# Patient Record
Sex: Male | Born: 1935 | Race: White | Hispanic: No | Marital: Single | Smoking: Never smoker
Health system: Southern US, Community
[De-identification: ages and names within clinical notes are randomized; demographics above are authoritative.]

## PROBLEM LIST (undated history)

## (undated) DIAGNOSIS — E785 Hyperlipidemia, unspecified: Secondary | ICD-10-CM

## (undated) DIAGNOSIS — F0390 Unspecified dementia without behavioral disturbance: Secondary | ICD-10-CM

## (undated) DIAGNOSIS — G309 Alzheimer's disease, unspecified: Secondary | ICD-10-CM

## (undated) DIAGNOSIS — G459 Transient cerebral ischemic attack, unspecified: Secondary | ICD-10-CM

## (undated) DIAGNOSIS — I10 Essential (primary) hypertension: Secondary | ICD-10-CM

## (undated) DIAGNOSIS — F0280 Dementia in other diseases classified elsewhere without behavioral disturbance: Secondary | ICD-10-CM

## (undated) HISTORY — PX: OTHER SURGICAL HISTORY: SHX169

---

## 2009-01-04 ENCOUNTER — Ambulatory Visit: Payer: Self-pay | Admitting: Gastroenterology

## 2009-01-07 ENCOUNTER — Ambulatory Visit: Payer: Self-pay | Admitting: Unknown Physician Specialty

## 2011-11-28 ENCOUNTER — Inpatient Hospital Stay: Payer: Self-pay | Admitting: Student

## 2013-12-13 ENCOUNTER — Inpatient Hospital Stay: Payer: Self-pay | Admitting: Internal Medicine

## 2013-12-13 LAB — DRUG SCREEN, URINE
Amphetamines, Ur Screen: NEGATIVE (ref ?–1000)
Barbiturates, Ur Screen: NEGATIVE (ref ?–200)
Benzodiazepine, Ur Scrn: NEGATIVE (ref ?–200)
Cannabinoid 50 Ng, Ur ~~LOC~~: NEGATIVE (ref ?–50)
Cocaine Metabolite,Ur ~~LOC~~: NEGATIVE (ref ?–300)
Phencyclidine (PCP) Ur S: NEGATIVE (ref ?–25)
Tricyclic, Ur Screen: NEGATIVE (ref ?–1000)

## 2013-12-13 LAB — COMPREHENSIVE METABOLIC PANEL
Anion Gap: 4 — ABNORMAL LOW (ref 7–16)
BUN: 31 mg/dL — ABNORMAL HIGH (ref 7–18)
Bilirubin,Total: 1.5 mg/dL — ABNORMAL HIGH (ref 0.2–1.0)
Chloride: 107 mmol/L (ref 98–107)
Creatinine: 0.54 mg/dL — ABNORMAL LOW (ref 0.60–1.30)
EGFR (Non-African Amer.): >60
Osmolality: 281 (ref 275–301)
Potassium: 3.9 mmol/L (ref 3.5–5.1)
SGOT(AST): 42 U/L — ABNORMAL HIGH (ref 15–37)
SGPT (ALT): 28 U/L (ref 12–78)
Sodium: 136 mmol/L (ref 136–145)
Total Protein: 7.9 g/dL (ref 6.4–8.2)

## 2013-12-13 LAB — URINALYSIS, COMPLETE
Blood: NEGATIVE
Leukocyte Esterase: NEGATIVE
Nitrite: NEGATIVE
Specific Gravity: 1.022 (ref 1.003–1.030)
WBC UR: <1 /HPF (ref 0–5)

## 2013-12-13 LAB — CK TOTAL AND CKMB (NOT AT ARMC): CK, Total: 200 U/L (ref 35–232)

## 2013-12-13 LAB — CBC
HCT: 41.4 % (ref 40.0–52.0)
MCH: 31.3 pg (ref 26.0–34.0)
RBC: 4.52 10*6/uL (ref 4.40–5.90)
WBC: 10.1 10*3/uL (ref 3.8–10.6)

## 2013-12-13 LAB — TROPONIN I: Troponin-I: 0.17 ng/mL — ABNORMAL HIGH

## 2013-12-14 LAB — CK TOTAL AND CKMB (NOT AT ARMC)
CK, Total: 148 U/L (ref 35–232)
CK, Total: 138 U/L (ref 35–232)
CK-MB: 3.4 ng/mL (ref 0.5–3.6)
CK-MB: 3 ng/mL (ref 0.5–3.6)

## 2013-12-14 LAB — COMPREHENSIVE METABOLIC PANEL WITH GFR
Albumin: 2.9 g/dL — ABNORMAL LOW
Alkaline Phosphatase: 38 U/L — ABNORMAL LOW
Anion Gap: 5 — ABNORMAL LOW
BUN: 25 mg/dL — ABNORMAL HIGH
Bilirubin,Total: 0.4 mg/dL
Calcium, Total: 8.5 mg/dL
Chloride: 109 mmol/L — ABNORMAL HIGH
Co2: 28 mmol/L
Creatinine: 0.95 mg/dL
EGFR (African American): >60
EGFR (Non-African Amer.): >60
Glucose: 133 mg/dL — ABNORMAL HIGH
Osmolality: 289
Potassium: 3.6 mmol/L
SGOT(AST): 24 U/L
SGPT (ALT): 21 U/L
Sodium: 142 mmol/L
Total Protein: 5.7 g/dL — ABNORMAL LOW

## 2013-12-14 LAB — LIPID PANEL
Cholesterol: 142 mg/dL
HDL Cholesterol: 60 mg/dL
Ldl Cholesterol, Calc: 71 mg/dL
Triglycerides: 54 mg/dL
VLDL Cholesterol, Calc: 11 mg/dL

## 2013-12-14 LAB — TROPONIN I
Troponin-I: 0.2 ng/mL — ABNORMAL HIGH
Troponin-I: 0.2 ng/mL — ABNORMAL HIGH

## 2013-12-14 LAB — SEDIMENTATION RATE: Erythrocyte Sed Rate: 6 mm/h

## 2013-12-15 LAB — COMPREHENSIVE METABOLIC PANEL
Albumin: 2.8 g/dL — ABNORMAL LOW (ref 3.4–5.0)
Alkaline Phosphatase: 41 U/L — ABNORMAL LOW
Anion Gap: 2 — ABNORMAL LOW (ref 7–16)
BUN: 23 mg/dL — ABNORMAL HIGH (ref 7–18)
Bilirubin,Total: 0.4 mg/dL (ref 0.2–1.0)
Calcium, Total: 8.5 mg/dL (ref 8.5–10.1)
Co2: 28 mmol/L (ref 21–32)
EGFR (Non-African Amer.): >60
Glucose: 100 mg/dL — ABNORMAL HIGH (ref 65–99)
Osmolality: 281 (ref 275–301)
Potassium: 4 mmol/L (ref 3.5–5.1)
SGOT(AST): 22 U/L (ref 15–37)
SGPT (ALT): 19 U/L (ref 12–78)

## 2013-12-15 LAB — CBC WITH DIFFERENTIAL/PLATELET
Basophil %: 0.9 %
Eosinophil #: 0.1 10*3/uL (ref 0.0–0.7)
Eosinophil %: 2 %
HCT: 36.5 % — ABNORMAL LOW (ref 40.0–52.0)
Lymphocyte #: 1.9 10*3/uL (ref 1.0–3.6)
MCH: 31.3 pg (ref 26.0–34.0)
MCHC: 34.1 g/dL (ref 32.0–36.0)
MCV: 92 fL (ref 80–100)
Monocyte #: 0.6 x10 3/mm (ref 0.2–1.0)
Monocyte %: 7.3 %
Neutrophil #: 4.8 10*3/uL (ref 1.4–6.5)
Neutrophil %: 64.3 %
RBC: 3.98 10*6/uL — ABNORMAL LOW (ref 4.40–5.90)
RDW: 13.4 % (ref 11.5–14.5)

## 2013-12-18 ENCOUNTER — Ambulatory Visit: Payer: Self-pay | Admitting: Neurology

## 2013-12-18 LAB — PSA: PSA: 1.1 ng/mL (ref 0.0–4.0)

## 2013-12-19 LAB — CBC WITH DIFFERENTIAL/PLATELET
Basophil %: 0.8 %
Eosinophil #: 0.2 10*3/uL (ref 0.0–0.7)
HCT: 38.7 % — ABNORMAL LOW (ref 40.0–52.0)
HGB: 13.1 g/dL (ref 13.0–18.0)
Lymphocyte #: 2 10*3/uL (ref 1.0–3.6)
Lymphocyte %: 23.6 %
MCH: 30.5 pg (ref 26.0–34.0)
Monocyte #: 0.8 x10 3/mm (ref 0.2–1.0)
Monocyte %: 8.7 %
Neutrophil #: 5.6 10*3/uL (ref 1.4–6.5)
Neutrophil %: 64.6 %
Platelet: 203 10*3/uL (ref 150–440)
RBC: 4.31 10*6/uL — ABNORMAL LOW (ref 4.40–5.90)
RDW: 13.3 % (ref 11.5–14.5)
WBC: 8.6 10*3/uL (ref 3.8–10.6)

## 2013-12-19 LAB — BASIC METABOLIC PANEL
Anion Gap: 5 — ABNORMAL LOW (ref 7–16)
BUN: 19 mg/dL — ABNORMAL HIGH (ref 7–18)
Calcium, Total: 9 mg/dL (ref 8.5–10.1)
Chloride: 105 mmol/L (ref 98–107)
Co2: 26 mmol/L (ref 21–32)
Creatinine: 0.87 mg/dL (ref 0.60–1.30)
EGFR (African American): >60
Glucose: 87 mg/dL (ref 65–99)
Osmolality: 274 (ref 275–301)
Sodium: 136 mmol/L (ref 136–145)

## 2013-12-21 LAB — BASIC METABOLIC PANEL
Anion Gap: 4 — ABNORMAL LOW (ref 7–16)
BUN: 22 mg/dL — ABNORMAL HIGH (ref 7–18)
Calcium, Total: 9 mg/dL (ref 8.5–10.1)
Chloride: 105 mmol/L (ref 98–107)
Co2: 29 mmol/L (ref 21–32)
Creatinine: 1.05 mg/dL (ref 0.60–1.30)
EGFR (African American): >60
EGFR (Non-African Amer.): >60
Glucose: 89 mg/dL (ref 65–99)
Osmolality: 278 (ref 275–301)
Potassium: 3.8 mmol/L (ref 3.5–5.1)
Sodium: 138 mmol/L (ref 136–145)

## 2013-12-21 LAB — CBC WITH DIFFERENTIAL/PLATELET
Basophil #: 0.1 10*3/uL (ref 0.0–0.1)
Basophil %: 0.9 %
Eosinophil #: 0.2 10*3/uL (ref 0.0–0.7)
Eosinophil %: 2.3 %
HCT: 38.2 % — ABNORMAL LOW (ref 40.0–52.0)
HGB: 12.9 g/dL — ABNORMAL LOW (ref 13.0–18.0)
Lymphocyte #: 1.6 10*3/uL (ref 1.0–3.6)
Lymphocyte %: 22 %
MCH: 30.4 pg (ref 26.0–34.0)
MCHC: 33.6 g/dL (ref 32.0–36.0)
MCV: 91 fL (ref 80–100)
Monocyte #: 0.7 10*3/uL (ref 0.2–1.0)
Monocyte %: 10 %
Neutrophil #: 4.8 10*3/uL (ref 1.4–6.5)
Neutrophil %: 64.8 %
Platelet: 213 10*3/uL (ref 150–440)
RBC: 4.22 10*6/uL — ABNORMAL LOW (ref 4.40–5.90)
RDW: 13.3 % (ref 11.5–14.5)
WBC: 7.4 10*3/uL (ref 3.8–10.6)

## 2015-04-12 NOTE — H&P (Signed)
PATIENT NAME:  Corey Knight, Corey Knight MR#:  [id number] DATE OF BIRTH:  [birth year]  DATE OF ADMISSION:  12/13/2013  PRIMARY CARE PHYSICIAN: Was Kem Kays.   CHIEF COMPLAINT: Confusion, difficulty with ambulation and hallucinations.   HISTORY OF PRESENT ILLNESS: This is a 80 year old male with a history of hypertension (not taking any medications for several months), hyperlipidemia, BPH who presents with above complaints. As per family who is at the bedside, over the last day, the patient has been having visual hallucinations, been very confused. He is leaning to the left side. He fell twice today. He has gait instability and trouble walking. This is very unusual for the patient. Apparently, the patient had visual hallucinations today. He is paranoid. He thinks that people are living in the woods. His blood pressure here was 217/105. He received labetalol and aspirin, and hospitalist was consulted for admission.   REVIEW OF SYSTEMS:  CONSTITUTIONAL: The patient denies any fever, fatigue, weakness.  EYES: No blurred or double vision.  ENT: No ear pain, hearing loss, seasonal allergies. Positive snoring. No postnasal drip or sinus pain.  RESPIRATORY: No cough, wheezing, hemoptysis or COPD.  CARDIOVASCULAR: No chest pain, orthopnea, palpitations, syncope, edema. Positive falls but no syncope. No loss of consciousness. No dyspnea on exertion.  GASTROINTESTINAL: No nausea, vomiting, diarrhea, abdominal pain, melena or ulcers.  GENITOURINARY: No dysuria or hematuria.  ENDOCRINE: No polyuria or polydipsia.  HEMATOLOGIC AND LYMPHATIC: Positive easy bruising. No bleeding or swollen glands.  SKIN: No rash or lesions.  MUSCULOSKELETAL: No cramps or gout.  NEUROLOGIC: No history of CVA, TIA or seizures.  PSYCHIATRIC: No history of anxiety or depression.   PAST MEDICAL HISTORY:  1. Hypertension.  2. Hyperlipidemia.  3. BPH.    MEDICATIONS: The patient is not taking any medications at this time. He is  supposed to be on hypertensive medications but is not taking any.   SOCIAL HISTORY: No tobacco, alcohol or drug use. He lives alone.   FAMILY HISTORY: Positive for Alzheimer's dementia and CVA.    SURGICAL HISTORY: None.   PHYSICAL EXAMINATION:  VITAL SIGNS: Temperature 98.3, pulse 63, respirations 18. Initial blood pressure was 217/105; now is 164/86. He is 98% on room air.  GENERAL: The patient is alert, oriented to name and place, not time.  HEENT: Head is atraumatic. Pupils are round and reactive. Sclerae anicteric. Mucous membranes are moist. Oropharynx is clear.  NECK: Supple without JVD, carotid bruit or enlarged thyroid.  CARDIOVASCULAR: Regular rate and rhythm. No murmurs, rubs or gallops. PMI is not displaced.  LUNGS: Clear to auscultation without crackles, rales, rhonchi or wheezing. Normal percussion. No egophony.  ABDOMEN: Bowel sounds are present. Nontender, nondistended. No hepatosplenomegaly.  NEUROLOGIC: Cranial nerves II through XII are intact. Musculoskeletal strength is 4 out of 5 in the left upper extremity, 5 out of 5 bilateral lower extremities. He has a slight right pronator drift. Babinski sign is downgoing. All other cranial nerves are intact.  EXTREMITIES: No clubbing, cyanosis some mild edema.   BACK: No CVA or vertebral tenderness.   LABORATORIES: White blood cells 10, hemoglobin 14, hematocrit 42, platelets of 172. Sodium 136, potassium 3.9, chloride 107, bicarb 25, BUN 31, creatinine 0.54, glucose is 144. Bilirubin 1.5, alk phos 64, ALT 20, AST 42, total protein 8, albumin is 3.7. CK is 200, CPK-MB 3.5, troponin 0.17. Urinalysis shows negative LCE and nitrites. Urine drug screen is negative. Chest x-ray shows no acute cardiopulmonary disease. CT of the head shows no acute intracranial  hemorrhage or CVA. EKG: Sinus rhythm with first-degree AV block.   ASSESSMENT AND PLAN: A 80 year old male who presents with malignant hypertension and encephalopathy, who has  encephalopathy since today with difficulty in ambulation and leaning to the left side.  1. Probable cerebrovascular accident as per the patient's symptoms or related to encephalopathy secondary to hypertension. The patient will be admitted to telemetry. I have ordered an MRI, carotid Doppler and echocardiogram and have started the patient on aspirin and statin medication. Will check fasting lipids. Ordered neurology checks q.4 hours. Further recommendations after the workup has been performed.  2. Malignant hypertension: The patient has a history of hypertension. He is not on any blood pressure medications right now. At this point will allow permissive hypertension as his symptoms seem to be consistent with a cerebrovascular accident. If the MRI is negative, then we will need to better control his blood pressure with goal systolic being less than 882. I will write for p.r.n. hydralazine order.  3. Encephalopathy secondary to hypertension or cerebrovascular accident: Will continue neurology checks and monitor.  4. Benign prostatic hypertrophy: It does not seem that the patient is taking any medications for this. I actually do not know what his medications are at this time. Will monitor for signs of urinary retention.  5. Edema of LE: dopplers to evaluate for DVT  The patient is FULL CODE status.    TIME SPENT: Approximately 50 minutes.   ____________________________ Donell Beers. Benjie Karvonen, MD spm:gb D: 12/13/2013 23:36:02 ET T: 12/14/2013 00:28:44 ET JOB#: [id number]  cc: Ryelle Ruvalcaba P. Benjie Karvonen, MD, <Dictator> Lorenza Evangelist, MD Donell Beers Kerim Statzer MD ELECTRONICALLY SIGNED 12/14/2013 2:13

## 2015-04-12 NOTE — H&P (Signed)
PATIENT NAME:  Beatris SiHALL, Aikeem D MR#:  [id number] DATE OF BIRTH:  [birth year]  DATE OF ADMISSION:  12/13/2013  ADDENDUM   The patient is also noted to have a troponin of 0.17. He has had chronically elevated troponins in the past of 0.12. He denies any chest pain but will follow. The patient is already on an aspirin, and echo has already been ordered.   ____________________________ Janyth ContesSital P. Juliene PinaMody, MD spm:gb D: 12/14/2013 00:58:15 ET T: 12/14/2013 02:37:56 ET JOB#: [id number]  cc: Maresha Anastos P. Juliene PinaMody, MD, <Dictator> Leniyah Martell P Kina Shiffman MD ELECTRONICALLY SIGNED 12/15/2013 0:02

## 2015-04-13 NOTE — Consult Note (Signed)
PATIENT NAME:  Corey Knight, Corey Knight MR#:  [id number] DATE OF BIRTH:  1935/05/01  DATE OF CONSULTATION:  12/19/2013  REFERRING PHYSICIAN:   CONSULTING PHYSICIAN:  Nakeisha Greenhouse S. Garnetta BuddyFaheem, MD  REASON FOR CONSULTATION: Confusion, difficulty with ambulation, hallucinations.   HISTORY OF PRESENT ILLNESS: The patient is a 80 year old white male with history of hypertension, hyperlipidemia, and BPH who was admitted due to having visual hallucinations and has been confused. He was noted to be leaning to the left side and he fell twice. Has been having gait instability and trouble walking. This is very unusual for the patient and his hallucinations have been worsening. He said that people are living in the woods. His blood pressure was elevated and he was given labetalol and aspirin and I was consulted for admission.   During my interview, the patient was noted to be lying in the bed and he appeared somewhat confused and his sister was present in the room. He reported that he was about to be discharged yesterday, but then they held his discharge. His sister reported that the patient continues to have visual hallucinations especially when he will wake up from sleep. He will think that something is falling from the window or from the curtains. However, he will quickly come back to his baseline. The patient reported that he wants to go back to his home. He was unable to tell me where he is. He told me that [place] but reported that his sister lives in [place]. He stated that he does not have any suicidal or homicidal ideations or plans. He denied having any depression, mood or anxiety symptoms.  Reported that the medications are usually the last resort for him.   PAST PSYCHIATRIC HISTORY: The patient has been diagnosed with Alzheimer dementia and currently started on Aricept by his primary physician. He also has been diagnosed with CVA.   CURRENT MEDICATIONS: The patient is supposed to be  on antihypertensive medication, but he has been noncompliant as an outpatient.   PAST SURGICAL HISTORY: None.   PAST MEDICAL HISTORY: Hypertension, hyperlipidemia, and BPH.  VITAL SIGNS: Temperature 98, pulse 60, respirations 18, blood pressure 153/78.  LABORATORY DATA: Glucose 87, BUN 19, creatinine 0.87, sodium 136, potassium 3.7, chloride 105, bicarbonate 26. Magnesium 1.7. Protein 5.7, AST 22, ALT 19. CK 138, CK-MB was 3. Troponin 0.2. TSH 1.58. UDS was negative. WBC 8.6, RBC 4.31, hemoglobin 13.1, hematocrit 38.7, platelet count 203, RDW 13.3.   MENTAL STATUS EXAMINATION: The patient is a moderately built male who was lying in the bed. He maintained fair eye contact. His speech was low in tone and volume. His mood was anxious. Affect was congruent. His remote memory is intact. He was unable to tell me which recent holiday passed and his sister has to give him a clue. He was also unable to tell me how many quarters are there in 2 dollars and 75 cents. His speech appears to be fluent. He cannot tell me the name of the hospital and was unable to tell me the name of the city he lives in. He appears somewhat apprehensive during the interview. He demonstrated poor insight and judgment.   DIAGNOSTIC IMPRESSION: AXIS I: Dementia, Alzheimer type, without behavior problems.  AXIS II: None.  AXIS III: Please review the medical history.   TREATMENT PLAN:  1.  I have reviewed the patient's records as well as evaluations done by the hospitalist and the neurologist. The patient is currently on  Aricept 5 mg p.o. daily, which was started today.  2.  I will add Namenda 5 mg p.o. daily to help with his memory issues. The patient does not to appear to be having significant hallucinations at this time.  3.  Social workers are helping with the disposition of this patient at this time.   Thank you for allowing me to participate in the care of this patient.  ____________________________ Ardeen Fillers. Garnetta Buddy,  MD usf:sb Knight: 12/19/2013 15:44:31 ET T: 12/19/2013 16:47:23 ET JOB#: [id number]  cc: Ardeen Fillers. Garnetta Buddy, MD, <Dictator> Rhunette Croft MD ELECTRONICALLY SIGNED 12/23/2013 18:32

## 2015-04-13 NOTE — Consult Note (Signed)
PATIENT NAME:  Corey Knight, Corey Knight MR#:  [id number] DATE OF BIRTH:  May 08, 1935  DATE OF CONSULTATION:  12/18/2013  CONSULTING PHYSICIAN:  Pauletta BrownsYuriy Kylen Schliep, MD  REASON FOR CONSULTATION: Altered mental status.   HISTORY OF PRESENT ILLNESS: This is a pleasant 80 year old male with past medical history of hypertension, not on any medications, hyperlipidemia, BPH, presented with confusion and difficulty with ambulation as well as hallucination. The patient was noted by family members to be leaning to the right. The patient apparently had paranoid delusions and hallucinations. There was concern for stroke. The patient was worked up for a CVA. MRI of the brain did not show any acute intracranial pathology.   REVIEW OF SYSTEMS:   CONSTITUTIONAL: No fever. No fatigue. No weakness.  EYES: No blurred vision, double vision.  ENT: No ear pain. No hearing loss.  RESPIRATORY: No cough. No wheezing.  CARDIOVASCULAR: No chest pain. No orthopnea,  GENITOURINARY: No nausea. No vomiting. No diarrhea.  GENITOURINARY: No dysuria. No hematuria. HEMATOLOGIC: No bleeding. No swollen glands.  NEUROLOGICAL: No history of seizures from a neurological standpoint.   SOCIAL HISTORY: No tobacco, alcohol or drug use.   PAST MEDICAL HISTORY: Hypertension, hyperlipidemia and BPH, not on any medications.   LABORATORY DATA: Includes a white blood cell count of 7.5, hemoglobin 12.5, hematocrit 36.5. BUN 23, creatinine 0.96, sodium 139, potassium 4.0. LFTs appear to be within normal range.   PHYSICAL EXAMINATION:   VITAL SIGNS: Include a temperature of 98.6, pulse 80, respirations 19, blood pressure 128/76, pulse oximetry 98%.  NEUROLOGICAL: The patient could not tell me the name of the hospital, could not tell me the exact date or the holiday which passed. His remote memory is intact. The patient was able to tell me how many quarters were in a $1.50, was able to count the days of the week backwards. Speech appears to be fluent.  Cranial nerve examination: Pupils 4 mm to 2 mm, reactive bilaterally. Facial sensation intact. Facial motor intact. Tongue is midline. Uvula elevates symmetrically. Shoulder shrug is intact. Motor strength examination: 5 out of 5 bilateral upper and lower extremities. Sensation intact to light touch and temperature. Coordination: Finger-to-nose intact. Gait not assessed. Reflexes 1+ symmetrical throughout.   IMPRESSION: This is a 80 year old male admitted with encephalopathy, questionably leaning to right, suspicious for a stroke. Work-up with MRI did not show any acute intracranial abnormalities. The patient's mental status slowly improved. Throughout it waxes and wanes. It was thought that this morning he appeared to be more confused, but when I saw him around 11:00 a.m., the patient could tell me the hospital, had difficulty with date and time. Calculation was intact and he could provide his full address. On review of the chart, no reasons for this encephalopathy could be found. From a neurological standpoint, I would not do any further testing at this point. I think there is a possibility of dehydration, possibly delirium, which is improving slowly and waxes and wanes throughout the day. I would not do any further testing.   Thank you. It was a pleasure seeing this patient.   ____________________________ Pauletta BrownsYuriy Kariel Skillman, MD yz:jcm Knight: 12/18/2013 12:08:44 ET T: 12/18/2013 13:33:54 ET JOB#: [id number]  cc: Pauletta BrownsYuriy Avie Checo, MD, <Dictator> Pauletta BrownsYURIY Lizzete Gough MD ELECTRONICALLY SIGNED 01/08/2014 17:15

## 2015-04-13 NOTE — Discharge Summary (Signed)
PATIENT NAME:  SLAYTER, MOORHOUSE MR#:  [id number] DATE OF BIRTH:  25-Nov-1935  DATE OF ADMISSION:  12/13/2013 DATE OF DISCHARGE:  12/22/2013  TYPE OF DISCHARGE: The patient is transferred to skilled nursing facility.   REASON FOR ADMISSION: Altered mental status.   HISTORY OF PRESENT ILLNESS: The patient is a 80 year old male with a history of benign hypertension, hyperlipidemia, medical noncompliance and BPH, who presented to the Emergency Room with progressive mental status changes associated with visual hallucinations. He was also "leaning to the left side." He had fallen twice. In the Emergency Room, the patient had accelerated hypertension and was admitted for further evaluation.   PAST MEDICAL HISTORY:  1.  Benign hypertension.  2.  Hyperlipidemia.  3.  Benign prostatic hypertrophy.   MEDICATIONS ON ADMISSION: None.   ALLERGIES: No known drug allergies.   SOCIAL HISTORY, FAMILY HISTORY AND REVIEW OF SYSTEMS: As per admission note.   PHYSICAL EXAM:  GENERAL: The patient was in no acute distress.  VITAL SIGNS: Remarkable for a blood initial blood pressure of 217/105, heart rate 63, respiratory rate of 18. He was afebrile.  HEENT: Exam was unremarkable.  NECK: Supple without JVD.  LUNGS: Clear.  CARDIAC: Regular rate and rhythm. Normal S1, S2.  ABDOMEN: Soft and nontender.  EXTREMITIES: Without edema.  NEUROLOGIC: Grossly nonfocal other than slight right pronator drift.   HOSPITAL COURSE: The patient was admitted with altered mental status with accelerated hypertension. His blood pressure regimen was optimized and his blood pressure improved. Initial head CT was unremarkable. MRI of the brain showed no acute intracranial abnormality. Small vessel chronic disease was noted. Carotid Dopplers and echocardiogram were nondiagnostic. He was seen by neurology, who felt that the patient had a hypertensive encephalopathy. He continued to have some confusion. He was seen by psychiatry, who  diagnosed the patient with Alzheimer's dementia. He was started on Aricept and Namenda. His symptoms improved slightly. The family was uneasy with the patient going home. A social work consult was obtained and a bed was found for placement. The patient is now transferred to a skilled nursing facility for further care and rehabilitation.   DISCHARGE DIAGNOSES:  1.  Progressive Alzheimer dementia.  2.  Accelerated hypertension.  3.  Hypertensive crisis with encephalopathy.  4.  Hyperlipidemia.  5.  BPH.   DISCHARGE MEDICATIONS:  1.  Aspirin 81 mg p.o. daily.  2.  Zocor 10 mg p.o. at bedtime.  3.  Lopressor 12.5 mg p.o. b.i.d.  4.  Benicar 40 mg p.o. daily.  5.  Norvasc 5 mg p.o. daily.  6.  Hydralazine 25 mg p.o. b.i.d.  7.  Aricept 5 mg p.o. at bedtime.  8.  Namenda 5 mg p.o. at bedtime.   FOLLOW-UP PLANS AND APPOINTMENTS: The patient is a full code. He will be followed by the resident physician at the skilled nursing facility. He is on a 2 gram sodium diet. He will be seen in consultation by physical therapy. Will obtain a CBC and a MET-B in 1 week.  ____________________________ Leonie Douglas. Doy Hutching, MD jds:aw D: 12/22/2013 07:59:15 ET T: 12/22/2013 08:05:55 ET JOB#: [id number]  cc: Leonie Douglas. Doy Hutching, MD, <Dictator> Drayton Tieu Lennice Sites MD ELECTRONICALLY SIGNED 12/22/2013 14:11

## 2015-05-11 ENCOUNTER — Emergency Department: Payer: Commercial Managed Care - HMO

## 2015-05-11 ENCOUNTER — Emergency Department
Admission: EM | Admit: 2015-05-11 | Discharge: 2015-05-11 | Disposition: A | Discharge status: Home / Self Care | Payer: Commercial Managed Care - HMO | Attending: Emergency Medicine | Admitting: Emergency Medicine

## 2015-05-11 ENCOUNTER — Encounter: Payer: Self-pay | Admitting: Emergency Medicine

## 2015-05-11 DIAGNOSIS — I10 Essential (primary) hypertension: Secondary | ICD-10-CM | POA: Insufficient documentation

## 2015-05-11 DIAGNOSIS — N201 Calculus of ureter: Secondary | ICD-10-CM | POA: Diagnosis not present

## 2015-05-11 DIAGNOSIS — R1031 Right lower quadrant pain: Secondary | ICD-10-CM | POA: Diagnosis present

## 2015-05-11 DIAGNOSIS — N1330 Unspecified hydronephrosis: Secondary | ICD-10-CM | POA: Diagnosis not present

## 2015-05-11 HISTORY — DX: Essential (primary) hypertension: I10

## 2015-05-11 HISTORY — DX: Unspecified dementia, unspecified severity, without behavioral disturbance, psychotic disturbance, mood disturbance, and anxiety: F03.90

## 2015-05-11 HISTORY — DX: Hyperlipidemia, unspecified: E78.5

## 2015-05-11 LAB — COMPREHENSIVE METABOLIC PANEL
ALBUMIN: 3.9 g/dL (ref 3.5–5.0)
ALT: 16 U/L — AB (ref 17–63)
AST: 18 U/L (ref 15–41)
Alkaline Phosphatase: 53 U/L (ref 38–126)
Anion gap: 8 (ref 5–15)
BILIRUBIN TOTAL: 0.6 mg/dL (ref 0.3–1.2)
BUN: 36 mg/dL — AB (ref 6–20)
CO2: 27 mmol/L (ref 22–32)
Calcium: 9.5 mg/dL (ref 8.9–10.3)
Chloride: 103 mmol/L (ref 101–111)
Creatinine, Ser: 2.31 mg/dL — ABNORMAL HIGH (ref 0.61–1.24)
GFR, EST AFRICAN AMERICAN: 30 mL/min — AB (ref >60)
GFR, EST NON AFRICAN AMERICAN: 26 mL/min — AB (ref >60)
GLUCOSE: 113 mg/dL — AB (ref 65–99)
Potassium: 4.3 mmol/L (ref 3.5–5.1)
Sodium: 138 mmol/L (ref 135–145)
Total Protein: 7.3 g/dL (ref 6.5–8.1)

## 2015-05-11 LAB — CBC WITH DIFFERENTIAL/PLATELET
Basophils Absolute: 0.1 10*3/uL (ref 0–0.1)
Basophils Relative: 1 %
Eosinophils Absolute: 0 10*3/uL (ref 0–0.7)
Eosinophils Relative: 0 %
HCT: 35.6 % — ABNORMAL LOW (ref 40.0–52.0)
HEMOGLOBIN: 11.8 g/dL — AB (ref 13.0–18.0)
LYMPHS ABS: 1.4 10*3/uL (ref 1.0–3.6)
LYMPHS PCT: 13 %
MCH: 30.7 pg (ref 26.0–34.0)
MCHC: 33.2 g/dL (ref 32.0–36.0)
MCV: 92.4 fL (ref 80.0–100.0)
MONOS PCT: 8 %
Monocytes Absolute: 0.9 10*3/uL (ref 0.2–1.0)
NEUTROS PCT: 78 %
Neutro Abs: 8.5 10*3/uL — ABNORMAL HIGH (ref 1.4–6.5)
Platelets: 217 10*3/uL (ref 150–440)
RBC: 3.85 MIL/uL — ABNORMAL LOW (ref 4.40–5.90)
RDW: 13.1 % (ref 11.5–14.5)
WBC: 10.8 10*3/uL — ABNORMAL HIGH (ref 3.8–10.6)

## 2015-05-11 LAB — URINALYSIS COMPLETE WITH MICROSCOPIC (ARMC ONLY)
Bacteria, UA: NONE SEEN
Bilirubin Urine: NEGATIVE
Glucose, UA: NEGATIVE mg/dL
Hgb urine dipstick: NEGATIVE
KETONES UR: NEGATIVE mg/dL
Leukocytes, UA: NEGATIVE
Nitrite: NEGATIVE
PH: 5 (ref 5.0–8.0)
Protein, ur: 30 mg/dL — AB
Specific Gravity, Urine: 1.02 (ref 1.005–1.030)
Squamous Epithelial / LPF: NONE SEEN

## 2015-05-11 MED ORDER — SODIUM CHLORIDE 0.9 % IV BOLUS (SEPSIS)
1000.0000 mL | Freq: Once | INTRAVENOUS | Status: AC
  Administered 2015-05-11: 1000 mL via INTRAVENOUS

## 2015-05-11 MED ORDER — FENTANYL CITRATE (PF) 100 MCG/2ML IJ SOLN
INTRAMUSCULAR | Status: AC
  Administered 2015-05-11: 25 ug via INTRAVENOUS
  Filled 2015-05-11: qty 2

## 2015-05-11 MED ORDER — ONDANSETRON HCL 4 MG/2ML IJ SOLN
INTRAMUSCULAR | Status: AC
  Administered 2015-05-11: 4 mg via INTRAVENOUS
  Filled 2015-05-11: qty 2

## 2015-05-11 MED ORDER — IOHEXOL 240 MG/ML SOLN: 25.0000 mL | INTRAMUSCULAR | Status: DC

## 2015-05-11 MED ORDER — OXYCODONE HCL 5 MG PO TABS: 5.0000 mg | ORAL_TABLET | Freq: Three times a day (TID) | ORAL | Status: DC | PRN

## 2015-05-11 MED ORDER — FENTANYL CITRATE (PF) 100 MCG/2ML IJ SOLN
25.0000 ug | Freq: Once | INTRAMUSCULAR | Status: AC
  Administered 2015-05-11: 25 ug via INTRAVENOUS

## 2015-05-11 MED ORDER — ONDANSETRON HCL 4 MG/2ML IJ SOLN
4.0000 mg | Freq: Once | INTRAMUSCULAR | Status: AC
  Administered 2015-05-11: 4 mg via INTRAVENOUS

## 2015-05-11 NOTE — ED Notes (Signed)
Patient to ED with c/o LLQ/groin pain for the last 2 days. Patient reports he is unsure if he might have a hernia or be constipated. Further reports no bowel movement in several days and when he did it was hard.

## 2015-05-11 NOTE — ED Provider Notes (Signed)
[facility] Emergency Department Provider Note  ____________________________________________  Time seen: Approximately 1709  I have reviewed the triage vital signs and the nursing notes.   HISTORY  Chief Complaint Abdominal Pain    HPI Corey Knight is a 80 y.o. male who presents to the emergency department for right lower quadrant pain. States the pain started suddenly 2 days ago. He denies history of same.He states his urine stream is slow to start.   Past Medical History  Diagnosis Date  . Hypertension   . Hyperlipemia   . Dementia     There are no active problems to display for this patient.   History reviewed. No pertinent past surgical history.  No current outpatient prescriptions on file.  Allergies Review of patient's allergies indicates no known allergies.  History reviewed. No pertinent family history.  Social History History  Substance Use Topics  . Smoking status: Never Smoker   . Smokeless tobacco: Never Used  . Alcohol Use: No    Review of Systems Constitutional: No fever/chills Eyes: No visual changes. ENT: No sore throat. Cardiovascular: Denies chest pain. Respiratory: Denies shortness of breath. Gastrointestinal: No abdominal pain.  No nausea, no vomiting.  No diarrhea.  No constipation. Genitourinary: Negative for dysuria. Change in urine stream. Musculoskeletal: Denies back pain. Skin: Negative for rash. Neurological: Negative for headaches, focal weakness or numbness.  10-point ROS otherwise negative.  ____________________________________________   PHYSICAL EXAM:  VITAL SIGNS: ED Triage Vitals  Enc Vitals Group     BP 05/11/15 1441 156/83 mmHg     Pulse Rate 05/11/15 1441 64     Resp 05/11/15 1441 20     Temp 05/11/15 1441 97.7 F (36.5 C)     Temp Source 05/11/15 1441 Oral     SpO2 05/11/15 1441 99 %     Weight 05/11/15 1441 165 lb (74.844 kg)     Height 05/11/15 1441  (1.803 m)     Head Cir  --      Peak Flow --      Pain Score 05/11/15 1822 10     Pain Loc --      Pain Edu? --      Excl. in GC? --     Constitutional: Alert and oriented. Well appearing and in no acute distress. Eyes: Conjunctivae are normal. PERRL. EOMI. Head: Atraumatic. Nose: No congestion/rhinnorhea. Mouth/Throat: Mucous membranes are moist.  Oropharynx non-erythematous. Neck: No stridor.   Cardiovascular: Normal rate, regular rhythm. Grossly normal heart sounds.  Good peripheral circulation. Respiratory: Normal respiratory effort.  No retractions. Lungs CTAB. Gastrointestinal: Soft and nontender. No distention. No abdominal bruits. No CVA tenderness. Right lower quadrant tenderness with palpation. Musculoskeletal: No lower extremity tenderness nor edema.  No joint effusions. Neurologic:  Normal speech and language. No gross focal neurologic deficits are appreciated. Speech is normal. No gait instability. Skin:  Skin is warm, dry and intact. No rash noted. Psychiatric: Mood and affect are normal. Speech and behavior are normal.  ____________________________________________   LABS (all labs ordered are listed, but only abnormal results are displayed)  Labs Reviewed  CBC WITH DIFFERENTIAL/PLATELET - Abnormal; Notable for the following:    WBC 10.8 (*)    RBC 3.85 (*)    Hemoglobin 11.8 (*)    HCT 35.6 (*)    Neutro Abs 8.5 (*)    All other components within normal limits  COMPREHENSIVE METABOLIC PANEL - Abnormal; Notable for the following:    Glucose, Bld 113 (*)  BUN 36 (*)    Creatinine, Ser 2.31 (*)    ALT 16 (*)    GFR calc non Af Amer 26 (*)    GFR calc Af Amer 30 (*)    All other components within normal limits  URINALYSIS COMPLETEWITH MICROSCOPIC ([facility])  - Abnormal; Notable for the following:    Color, Urine YELLOW (*)    APPearance CLEAR (*)    Protein, ur 30 (*)    All other components within normal limits    ____________________________________________  EKG   ____________________________________________  RADIOLOGY  Abdominal x-ray concerning for stone.  CT shows 8 mm proximal right ureteral stone with mild hydronephrosis ____________________________________________   PROCEDURES  Procedure(s) performed: None  Critical Care performed: No  ____________________________________________   INITIAL IMPRESSION / ASSESSMENT AND PLAN / ED COURSE  Pertinent labs & imaging results that were available during my care of the patient were reviewed by me and considered in my medical decision making (see chart for details).  initial impression kidney stone.  ----------------------------------------- 7:28 PM on 05/11/2015 -----------------------------------------  Urology paged to discuss further plan. Patient's pain well controlled after IV fluids and pain medication.  ----------------------------------------- 7:41 PM on 05/11/2015 -----------------------------------------  Patient resting comfortably on the stretcher. He denies pain at this time. After speaking with Dr.Dahlstedt, patient will be discharged home and follow up outpatient. Patient and family were given return precautions. They were advised to call first thing Monday morning to schedule an appointment with his urologist or Dr. Apolinar JunesBrandon. He was advised that the pain medication Corey cause some dizziness and was advised to use caution when changing positions or walking.  __________________________________________   FINAL CLINICAL IMPRESSION(S) / ED DIAGNOSES  Final diagnoses:  None    Chinita PesterCari B Farron Watrous, FNP 05/11/15 1944  Myrna Blazeravid Matthew Schaevitz, MD 05/12/15 [phone]

## 2015-05-11 NOTE — ED Notes (Signed)
Pt discharged with family, pt and family verbalize understanding of discharge instructions. E signature pad would not work.

## 2015-05-11 NOTE — ED Notes (Signed)
Pt returned from CT °

## 2015-05-16 ENCOUNTER — Ambulatory Visit
Admission: AD | Admit: 2015-05-16 | Discharge: 2015-05-16 | Disposition: A | Discharge status: Home / Self Care | Payer: Commercial Managed Care - HMO | Source: Ambulatory Visit | Attending: Urology | Admitting: Urology

## 2015-05-16 ENCOUNTER — Other Ambulatory Visit: Payer: Self-pay

## 2015-05-16 ENCOUNTER — Encounter
Admission: AD | Disposition: A | Discharge status: Home / Self Care | Payer: Self-pay | Source: Ambulatory Visit | Attending: Urology

## 2015-05-16 DIAGNOSIS — N132 Hydronephrosis with renal and ureteral calculous obstruction: Secondary | ICD-10-CM | POA: Insufficient documentation

## 2015-05-16 DIAGNOSIS — F0390 Unspecified dementia without behavioral disturbance: Secondary | ICD-10-CM | POA: Diagnosis not present

## 2015-05-16 DIAGNOSIS — Z79899 Other long term (current) drug therapy: Secondary | ICD-10-CM | POA: Insufficient documentation

## 2015-05-16 DIAGNOSIS — I10 Essential (primary) hypertension: Secondary | ICD-10-CM | POA: Insufficient documentation

## 2015-05-16 DIAGNOSIS — Z8673 Personal history of transient ischemic attack (TIA), and cerebral infarction without residual deficits: Secondary | ICD-10-CM | POA: Diagnosis not present

## 2015-05-16 DIAGNOSIS — N201 Calculus of ureter: Secondary | ICD-10-CM

## 2015-05-16 HISTORY — PX: EXTRACORPOREAL SHOCK WAVE LITHOTRIPSY: SHX1557

## 2015-05-16 SURGERY — LITHOTRIPSY, ESWL
Anesthesia: Moderate Sedation | Laterality: Right

## 2015-05-16 MED ORDER — MORPHINE SULFATE 10 MG/ML IJ SOLN
10.0000 mg | Freq: Once | INTRAMUSCULAR | Status: AC
Start: 2015-05-16 — End: 2015-05-16
  Administered 2015-05-16: 10 mg via INTRAMUSCULAR

## 2015-05-16 MED ORDER — NUCYNTA 50 MG PO TABS: 50.0000 mg | ORAL_TABLET | Freq: Four times a day (QID) | ORAL | Status: AC | PRN

## 2015-05-16 MED ORDER — DIPHENHYDRAMINE HCL 25 MG PO CAPS
25.0000 mg | ORAL_CAPSULE | ORAL | Status: AC
  Administered 2015-05-16: 25 mg via ORAL

## 2015-05-16 MED ORDER — LEVOFLOXACIN 500 MG PO TABS
500.0000 mg | ORAL_TABLET | ORAL | Status: AC
  Administered 2015-05-16: 500 mg via ORAL

## 2015-05-16 MED ORDER — PROMETHAZINE HCL 25 MG/ML IJ SOLN
25.0000 mg | Freq: Once | INTRAMUSCULAR | Status: AC
  Administered 2015-05-16: 25 mg via INTRAMUSCULAR

## 2015-05-16 MED ORDER — ONDANSETRON 8 MG PO TBDP: 8.0000 mg | ORAL_TABLET | Freq: Four times a day (QID) | ORAL | Status: AC | PRN

## 2015-05-16 MED ORDER — LEVOFLOXACIN 500 MG PO TABS: 500.0000 mg | ORAL_TABLET | Freq: Every day | ORAL | Status: DC

## 2015-05-16 MED ORDER — DEXTROSE-NACL 5-0.45 % IV SOLN
INTRAVENOUS | Status: DC
  Administered 2015-05-16: 15:00:00 via INTRAVENOUS

## 2015-05-16 MED ORDER — MIDAZOLAM HCL 5 MG/ML IJ SOLN
1.0000 mg | Freq: Once | INTRAMUSCULAR | Status: AC
  Administered 2015-05-16: 1 mg via INTRAMUSCULAR

## 2015-05-16 MED ORDER — TAMSULOSIN HCL 0.4 MG PO CAPS
0.4000 mg | ORAL_CAPSULE | Freq: Every day | ORAL | Status: AC
Start: 2015-05-16 — End: ?

## 2015-05-16 NOTE — OR Nursing (Signed)
Right side bruising from litho.

## 2015-05-16 NOTE — H&P (Signed)
   History reviewed, patient examined, no change in status, stable for surgery.  

## 2015-05-16 NOTE — Discharge Instructions (Signed)
Lithotripsy for Kidney Stones °Lithotripsy is a treatment that can sometimes help eliminate kidney stones and pain that they cause. A form of lithotripsy, also known as extracorporeal shock wave lithotripsy, is a nonsurgical procedure that helps your body rid itself of the kidney stone when it is too big to pass on its own. Extracorporeal shock wave lithotripsy is a method of crushing a kidney stone with shock waves. These shock waves pass through your body and are focused on your stone. They cause the kidney stones to crumble while still in the urinary tract. It is then easier for the smaller pieces of stone to pass in the urine. °Lithotripsy usually takes about an hour. It is done in a hospital, a lithotripsy center, or a mobile unit. It usually does not require an overnight stay. Your health care provider will instruct you on preparation for the procedure. Your health care provider will tell you what to expect afterward. °LET YOUR HEALTH CARE PROVIDER KNOW ABOUT: °· Any allergies you have. °· All medicines you are taking, including vitamins, herbs, eye drops, creams, and over-the-counter medicines. °· Previous problems you or members of your family have had with the use of anesthetics. °· Any blood disorders you have. °· Previous surgeries you have had. °· Medical conditions you have. °RISKS AND COMPLICATIONS °Generally, lithotripsy for kidney stones is a safe procedure. However, as with any procedure, complications can occur. Possible complications include: °· Infection. °· Bleeding of the kidney. °· Bruising of the kidney or skin. °· Obstruction of the ureter. °· Failure of the stone to fragment. °BEFORE THE PROCEDURE °· Do not eat or drink for 6-8 hours prior to the procedure. You may, however, take the medications with a sip of water that your physician instructs you to take °· Do not take aspirin or aspirin-containing products for 7 days prior to your procedure °· Do not take nonsteroidal anti-inflammatory  products for 7 days prior to your procedure °PROCEDURE °A stent (flexible tube with holes) may be placed in your ureter. The ureter is the tube that transports the urine from the kidneys to the bladder. Your health care provider may place a stent before the procedure. This will help keep urine flowing from the kidney if the fragments of the stone block the ureter. You may have an IV tube placed in one of your veins to give you fluids and medicines. These medicines may help you relax or make you sleep. During the procedure, you will lie comfortably on a fluid-filled cushion or in a warm-water bath. After an X-ray or ultrasound exam to locate your stone, shock waves are aimed at the stone. If you are awake, you may feel a tapping sensation as the shock waves pass through your body. If large stone particles remain after treatment, a second procedure may be necessary at a later date. °For comfort during the test: °· Relax as much as possible. °· Try to remain still as much as possible. °· Try to follow instructions to speed up the test. °· Let your health care provider know if you are uncomfortable, anxious, or in pain. °AFTER THE PROCEDURE  °After surgery, you will be taken to the recovery area. A nurse will watch and check your progress. Once you're awake, stable, and taking fluids well, you will be allowed to go home as long as there are no problems. You will also be allowed to pass your urine before discharge. You may be given antibiotics to help prevent infection. You may also be prescribed   pain medicine if needed. In a week or two, your health care provider may remove your stent, if you have one. You may first have an X-ray exam to check on how successful the fragmentation of your stone has been and how much of the stone has passed. Your health care provider will check to see whether or not stone particles remain. SEEK IMMEDIATE MEDICAL CARE IF:  You develop a fever or shaking chills.  Your pain is not  relieved by medicine.  You feel sick to your stomach (nauseated) and you vomit.  You develop heavy bleeding.  You have difficulty urinating.  You start to pass your stent from your penis. Document Released: 12/04/2000 Document Revised: 09/27/2013 Document Reviewed: 06/22/2013 [facility] Patient Information 2015 [place], [place]. This information is not intended to replace advice given to you by your health care provider. Make sure you discuss any questions you have with your health care provider.  Kidney Stones Kidney stones (urolithiasis) are deposits that form inside your kidneys. The intense pain is caused by the stone moving through the urinary tract. When the stone moves, the ureter goes into spasm around the stone. The stone is usually passed in the urine.  CAUSES   A disorder that makes certain neck glands produce too much parathyroid hormone (primary hyperparathyroidism).  A buildup of uric acid crystals, similar to gout in your joints.  Narrowing (stricture) of the ureter.  A kidney obstruction present at birth (congenital obstruction).  Previous surgery on the kidney or ureters.  Numerous kidney infections. SYMPTOMS   Feeling sick to your stomach (nauseous).  Throwing up (vomiting).  Blood in the urine (hematuria).  Pain that usually spreads (radiates) to the groin.  Frequency or urgency of urination. DIAGNOSIS   Taking a history and physical exam.  Blood or urine tests.  CT scan.  Occasionally, an examination of the inside of the urinary bladder (cystoscopy) is performed. TREATMENT   Observation.  Increasing your fluid intake.  Extracorporeal shock wave lithotripsy--This is a noninvasive procedure that uses shock waves to break up kidney stones.  Surgery may be needed if you have severe pain or persistent obstruction. There are various surgical procedures. Most of the procedures are performed with the use of small instruments. Only small incisions are  needed to accommodate these instruments, so recovery time is minimized. The size, location, and chemical composition are all important variables that will determine the proper choice of action for you. Talk to your health care provider to better understand your situation so that you will minimize the risk of injury to yourself and your kidney.  HOME CARE INSTRUCTIONS   Drink enough water and fluids to keep your urine clear or pale yellow. This will help you to pass the stone or stone fragments.  Strain all urine through the provided strainer. Keep all particulate matter and stones for your health care provider to see. The stone causing the pain may be as small as a grain of salt. It is very important to use the strainer each and every time you pass your urine. The collection of your stone will allow your health care provider to analyze it and verify that a stone has actually passed. The stone analysis will often identify what you can do to reduce the incidence of recurrences.  Only take over-the-counter or prescription medicines for pain, discomfort, or fever as directed by your health care provider.  Make a follow-up appointment with your health care provider as directed.  Get follow-up X-rays if  required. The absence of pain does not always mean that the stone has passed. It may have only stopped moving. If the urine remains completely obstructed, it can cause loss of kidney function or even complete destruction of the kidney. It is your responsibility to make sure X-rays and follow-ups are completed. Ultrasounds of the kidney can show blockages and the status of the kidney. Ultrasounds are not associated with any radiation and can be performed easily in a matter of minutes. SEEK MEDICAL CARE IF:  You experience pain that is progressive and unresponsive to any pain medicine you have been prescribed. SEEK IMMEDIATE MEDICAL CARE IF:   Pain cannot be controlled with the prescribed medicine.  You  have a fever or shaking chills.  The severity or intensity of pain increases over 18 hours and is not relieved by pain medicine.  You develop a new onset of abdominal pain.  You feel faint or pass out.  You are unable to urinate. MAKE SURE YOU:   Understand these instructions.  Will watch your condition.  Will get help right away if you are not doing well or get worse. Document Released: 12/07/2005 Document Revised: 08/09/2013 Document Reviewed: 05/10/2013 [facility] Patient Information 2015 [place]. This information is not intended to replace advice given to you by your health care provider. Make sure you discuss any questions you have with your health care provider. AMBULATORY SURGERY  DISCHARGE INSTRUCTIONS   1) The drugs that you were given will stay in your system until tomorrow so for the next 24 hours you should not:  A) Drive an automobile B) Make any legal decisions C) Drink any alcoholic beverage   2) You may resume regular meals tomorrow.  Today it is better to start with liquids and gradually work up to solid foods.  You may eat anything you prefer, but it is better to start with liquids, then soup and crackers, and gradually work up to solid foods.   3) Please notify your doctor immediately if you have any unusual bleeding, trouble breathing, redness and pain at the surgery site, drainage, fever, or pain not relieved by medication. 4)                  [facility]  [facility]ospital Number [phone]

## 2015-07-01 ENCOUNTER — Encounter (HOSPITAL_COMMUNITY): Payer: Self-pay | Admitting: *Deleted

## 2015-07-01 ENCOUNTER — Inpatient Hospital Stay (HOSPITAL_COMMUNITY)
Admission: EM | Admit: 2015-07-01 | Discharge: 2015-07-09 | DRG: 884 | Disposition: A | Discharge status: Skilled Nursing Facility | Payer: Commercial Managed Care - HMO | Attending: Internal Medicine | Admitting: Internal Medicine

## 2015-07-01 ENCOUNTER — Emergency Department (HOSPITAL_COMMUNITY): Payer: Commercial Managed Care - HMO

## 2015-07-01 DIAGNOSIS — R7989 Other specified abnormal findings of blood chemistry: Secondary | ICD-10-CM | POA: Diagnosis not present

## 2015-07-01 DIAGNOSIS — E43 Unspecified severe protein-calorie malnutrition: Secondary | ICD-10-CM | POA: Diagnosis present

## 2015-07-01 DIAGNOSIS — F0390 Unspecified dementia without behavioral disturbance: Secondary | ICD-10-CM | POA: Diagnosis present

## 2015-07-01 DIAGNOSIS — Z8673 Personal history of transient ischemic attack (TIA), and cerebral infarction without residual deficits: Secondary | ICD-10-CM

## 2015-07-01 DIAGNOSIS — L8990 Pressure ulcer of unspecified site, unspecified stage: Secondary | ICD-10-CM | POA: Diagnosis not present

## 2015-07-01 DIAGNOSIS — I959 Hypotension, unspecified: Secondary | ICD-10-CM | POA: Diagnosis present

## 2015-07-01 DIAGNOSIS — D72829 Elevated white blood cell count, unspecified: Secondary | ICD-10-CM | POA: Diagnosis present

## 2015-07-01 DIAGNOSIS — Z79899 Other long term (current) drug therapy: Secondary | ICD-10-CM | POA: Diagnosis not present

## 2015-07-01 DIAGNOSIS — R4182 Altered mental status, unspecified: Secondary | ICD-10-CM | POA: Diagnosis present

## 2015-07-01 DIAGNOSIS — N400 Enlarged prostate without lower urinary tract symptoms: Secondary | ICD-10-CM | POA: Diagnosis present

## 2015-07-01 DIAGNOSIS — E870 Hyperosmolality and hypernatremia: Secondary | ICD-10-CM | POA: Diagnosis present

## 2015-07-01 DIAGNOSIS — N289 Disorder of kidney and ureter, unspecified: Secondary | ICD-10-CM | POA: Diagnosis present

## 2015-07-01 DIAGNOSIS — G9340 Encephalopathy, unspecified: Secondary | ICD-10-CM | POA: Diagnosis present

## 2015-07-01 DIAGNOSIS — F0280 Dementia in other diseases classified elsewhere without behavioral disturbance: Secondary | ICD-10-CM | POA: Diagnosis present

## 2015-07-01 DIAGNOSIS — I248 Other forms of acute ischemic heart disease: Secondary | ICD-10-CM | POA: Diagnosis present

## 2015-07-01 DIAGNOSIS — Z515 Encounter for palliative care: Secondary | ICD-10-CM | POA: Diagnosis not present

## 2015-07-01 DIAGNOSIS — Z8249 Family history of ischemic heart disease and other diseases of the circulatory system: Secondary | ICD-10-CM

## 2015-07-01 DIAGNOSIS — G309 Alzheimer's disease, unspecified: Secondary | ICD-10-CM | POA: Diagnosis present

## 2015-07-01 DIAGNOSIS — L89899 Pressure ulcer of other site, unspecified stage: Secondary | ICD-10-CM | POA: Diagnosis present

## 2015-07-01 DIAGNOSIS — R739 Hyperglycemia, unspecified: Secondary | ICD-10-CM | POA: Diagnosis present

## 2015-07-01 DIAGNOSIS — N179 Acute kidney failure, unspecified: Secondary | ICD-10-CM | POA: Diagnosis present

## 2015-07-01 DIAGNOSIS — Z66 Do not resuscitate: Secondary | ICD-10-CM | POA: Diagnosis present

## 2015-07-01 DIAGNOSIS — E860 Dehydration: Secondary | ICD-10-CM | POA: Diagnosis present

## 2015-07-01 DIAGNOSIS — I10 Essential (primary) hypertension: Secondary | ICD-10-CM | POA: Diagnosis present

## 2015-07-01 DIAGNOSIS — E785 Hyperlipidemia, unspecified: Secondary | ICD-10-CM | POA: Diagnosis present

## 2015-07-01 HISTORY — DX: Transient cerebral ischemic attack, unspecified: G45.9

## 2015-07-01 HISTORY — DX: Alzheimer's disease, unspecified: G30.9

## 2015-07-01 HISTORY — DX: Dementia in other diseases classified elsewhere, unspecified severity, without behavioral disturbance, psychotic disturbance, mood disturbance, and anxiety: F02.80

## 2015-07-01 LAB — COMPREHENSIVE METABOLIC PANEL
ALBUMIN: 3.6 g/dL (ref 3.5–5.0)
ALT: 47 U/L (ref 17–63)
ANION GAP: 9 (ref 5–15)
AST: 54 U/L — AB (ref 15–41)
Alkaline Phosphatase: 52 U/L (ref 38–126)
BUN: 56 mg/dL — ABNORMAL HIGH (ref 6–20)
CO2: 28 mmol/L (ref 22–32)
CREATININE: 1.53 mg/dL — AB (ref 0.61–1.24)
Calcium: 10.5 mg/dL — ABNORMAL HIGH (ref 8.9–10.3)
Chloride: 123 mmol/L — ABNORMAL HIGH (ref 101–111)
GFR calc Af Amer: 49 mL/min — ABNORMAL LOW (ref >60)
GFR, EST NON AFRICAN AMERICAN: 42 mL/min — AB (ref >60)
Glucose, Bld: 142 mg/dL — ABNORMAL HIGH (ref 65–99)
POTASSIUM: 3.9 mmol/L (ref 3.5–5.1)
Sodium: 160 mmol/L — ABNORMAL HIGH (ref 135–145)
TOTAL PROTEIN: 7.4 g/dL (ref 6.5–8.1)
Total Bilirubin: 1.7 mg/dL — ABNORMAL HIGH (ref 0.3–1.2)

## 2015-07-01 LAB — CBC
HCT: 50.9 % (ref 39.0–52.0)
Hemoglobin: 16 g/dL (ref 13.0–17.0)
MCH: 30.1 pg (ref 26.0–34.0)
MCHC: 31.4 g/dL (ref 30.0–36.0)
MCV: 95.9 fL (ref 78.0–100.0)
Platelets: 187 10*3/uL (ref 150–400)
RBC: 5.31 MIL/uL (ref 4.22–5.81)
RDW: 13.9 % (ref 11.5–15.5)
WBC: 16.5 10*3/uL — ABNORMAL HIGH (ref 4.0–10.5)

## 2015-07-01 LAB — DIFFERENTIAL
Basophils Absolute: 0 10*3/uL (ref 0.0–0.1)
Basophils Relative: 0 % (ref 0–1)
Eosinophils Absolute: 0 10*3/uL (ref 0.0–0.7)
Eosinophils Relative: 0 % (ref 0–5)
LYMPHS ABS: 1.3 10*3/uL (ref 0.7–4.0)
Lymphocytes Relative: 8 % — ABNORMAL LOW (ref 12–46)
Monocytes Absolute: 1.2 10*3/uL — ABNORMAL HIGH (ref 0.1–1.0)
Monocytes Relative: 7 % (ref 3–12)
NEUTROS ABS: 14 10*3/uL — AB (ref 1.7–7.7)
Neutrophils Relative %: 85 % — ABNORMAL HIGH (ref 43–77)

## 2015-07-01 LAB — I-STAT CHEM 8, ED
BUN: 54 mg/dL — ABNORMAL HIGH (ref 6–20)
CREATININE: 1.7 mg/dL — AB (ref 0.61–1.24)
Calcium, Ion: 1.28 mmol/L (ref 1.13–1.30)
Chloride: 122 mmol/L — ABNORMAL HIGH (ref 101–111)
Glucose, Bld: 139 mg/dL — ABNORMAL HIGH (ref 65–99)
HEMATOCRIT: 49 % (ref 39.0–52.0)
Hemoglobin: 16.7 g/dL (ref 13.0–17.0)
Potassium: 3.7 mmol/L (ref 3.5–5.1)
Sodium: 159 mmol/L — ABNORMAL HIGH (ref 135–145)
TCO2: 25 mmol/L (ref 0–100)

## 2015-07-01 LAB — PROTIME-INR
INR: 1.26 (ref 0.00–1.49)
PROTHROMBIN TIME: 15.9 s — AB (ref 11.6–15.2)

## 2015-07-01 LAB — I-STAT TROPONIN, ED: Troponin i, poc: 0.22 ng/mL (ref 0.00–0.08)

## 2015-07-01 LAB — CK: Total CK: 283 U/L (ref 49–397)

## 2015-07-01 LAB — ETHANOL: Alcohol, Ethyl (B): <5 mg/dL (ref <5)

## 2015-07-01 LAB — APTT: APTT: 25 s (ref 24–37)

## 2015-07-01 MED ORDER — DEXTROSE 5 % IV SOLN
Freq: Once | INTRAVENOUS | Status: DC
Start: 2015-07-01 — End: 2015-07-01

## 2015-07-01 MED ORDER — DEXTROSE 5 % IV SOLN
Freq: Once | INTRAVENOUS | Status: AC
  Administered 2015-07-01: via INTRAVENOUS

## 2015-07-01 MED ORDER — SODIUM CHLORIDE 0.9 % IV BOLUS (SEPSIS)
1000.0000 mL | Freq: Once | INTRAVENOUS | Status: AC
  Administered 2015-07-01: 1000 mL via INTRAVENOUS

## 2015-07-01 NOTE — ED Notes (Signed)
Pt was cleared from spinal board, still has ccollar in place

## 2015-07-01 NOTE — ED Notes (Signed)
Bed: WA03 Expected date:  Expected time:  Means of arrival:  Comments: Hold for EMS

## 2015-07-01 NOTE — ED Provider Notes (Signed)
CSN: [id number]     Arrival date & time 07/01/15  2002 History   First MD Initiated Contact with Patient 07/01/15 2030     Chief Complaint  Patient presents with  . Altered Mental Status  . Fall   Level V caveat: Altered mental status HPI The patient was brought into the emergency room for evaluation after being found lying down on the floor at his home. The family last heard from the patient on Friday. They had not heard from in a couple of days and they went to check on him. The patient has a history of dementia but is normally able to care for himself. I found the patient lying on the left side in his bathroom. Patient has not been able to speak. He appears to have been lying on the floor for at least several days. He has multiple pressure sores on the left side of his body. The patient is unable to communicate and answer any questions. Past Medical History  Diagnosis Date  . Dementia   . Alzheimer disease   . Hypertension   . TIA (transient ischemic attack)    No past surgical history on file. No family history on file. History  Substance Use Topics  . Smoking status: Not on file  . Smokeless tobacco: Not on file  . Alcohol Use: Not on file    Review of Systems  All other systems reviewed and are negative.     Allergies  Review of patient's allergies indicates no known allergies.  Home Medications   Prior to Admission medications   Medication Sig Start Date End Date Taking? Authorizing Provider  amLODipine (NORVASC) 10 MG tablet Take 10 mg by mouth daily.   Yes Historical Provider, MD  donepezil (ARICEPT) 10 MG tablet Take 10 mg by mouth at bedtime.   Yes Historical Provider, MD  hydrALAZINE (APRESOLINE) 25 MG tablet Take 50 mg by mouth Nightly.    Yes Historical Provider, MD  memantine (NAMENDA) 10 MG tablet Take 10 mg by mouth 2 (two) times daily.   Yes Historical Provider, MD  metoprolol succinate (TOPROL-XL) 50 MG 24 hr tablet Take 25 mg by mouth daily. Take with or  immediately following a meal.   Yes Historical Provider, MD  simvastatin (ZOCOR) 10 MG tablet Take 10 mg by mouth daily.   Yes Historical Provider, MD  levofloxacin (LEVAQUIN) 500 MG tablet Take 1 tablet (500 mg total) by mouth daily. 05/16/15   Orson Ape, MD  losartan-hydrochlorothiazide (HYZAAR) 100-12.5 MG per tablet Take 1 tablet by mouth daily.    Historical Provider, MD  NUCYNTA 50 MG TABS tablet Take 1 tablet (50 mg total) by mouth every 6 (six) hours as needed. 1 TO 2 TABS Q 6 HOURS PRN PAIN 05/16/15   Orson Ape, MD  ondansetron (ZOFRAN ODT) 8 MG disintegrating tablet Take 1 tablet (8 mg total) by mouth every 6 (six) hours as needed for nausea or vomiting. 05/16/15   Orson Ape, MD  tamsulosin (FLOMAX) 0.4 MG CAPS capsule Take 1 capsule (0.4 mg total) by mouth daily. 05/16/15   Orson Ape, MD   BP 152/95 mmHg  Pulse 84  Resp 18  SpO2 99% Physical Exam  Constitutional: No distress.  Malodorous, disheveled  HENT:  Head: Normocephalic.  Right Ear: External ear normal.  Left Ear: External ear normal.  Patient has a black eschar-type pressure sore on the left side of his head  Eyes: Conjunctivae are normal. Right eye  exhibits no discharge. Left eye exhibits no discharge. No scleral icterus.  Eyes appeared deviated to the right  Neck: Neck supple. No tracheal deviation present.  Cardiovascular: Normal rate, regular rhythm and intact distal pulses.   Pulmonary/Chest: Effort normal and breath sounds normal. No stridor. No respiratory distress. He has no wheezes. He has no rales.  Abdominal: Soft. Bowel sounds are normal. He exhibits no distension. There is no tenderness. There is no rebound and no guarding.  Musculoskeletal: He exhibits no edema or tenderness.  No gross deformities, patient does not appear to grimace when his extremities are palpated, there are several areas on the skin on the left side of his body where he is black eschar is associated with decubitus  wounds  Neurological: No cranial nerve deficit (The patient is not speaking, he tries to mumble, no obvious facial droop). He exhibits abnormal muscle tone. He displays no seizure activity.  Patient is able to move both his hands and feet very slightly although he appears to move his right side more than the left, he is looking to the right, appears to have some left-sided neglect,   Skin: Skin is warm. No rash noted. He is diaphoretic.  Psychiatric: He has a normal mood and affect.  Nursing note and vitals reviewed.   ED Course  Procedures (including critical care time) CRITICAL CARE Performed by: XBJYN,WGNKNAPP,Denai Caba Total critical care time: 35 Critical care time was exclusive of separately billable procedures and treating other patients. Critical care was necessary to treat or prevent imminent or life-threatening deterioration. Critical care was time spent personally by me on the following activities: development of treatment plan with patient and/or surrogate as well as nursing, discussions with consultants, evaluation of patient's response to treatment, examination of patient, obtaining history from patient or surrogate, ordering and performing treatments and interventions, ordering and review of laboratory studies, ordering and review of radiographic studies, pulse oximetry and re-evaluation of patient's condition.  Labs Review Labs Reviewed  PROTIME-INR - Abnormal; Notable for the following:    Prothrombin Time 15.9 (*)    All other components within normal limits  CBC - Abnormal; Notable for the following:    WBC 16.5 (*)    All other components within normal limits  DIFFERENTIAL - Abnormal; Notable for the following:    Neutrophils Relative % 85 (*)    Neutro Abs 14.0 (*)    Lymphocytes Relative 8 (*)    Monocytes Absolute 1.2 (*)    All other components within normal limits  COMPREHENSIVE METABOLIC PANEL - Abnormal; Notable for the following:    Sodium 160 (*)    Chloride 123 (*)     Glucose, Bld 142 (*)    BUN 56 (*)    Creatinine, Ser 1.53 (*)    Calcium 10.5 (*)    AST 54 (*)    Total Bilirubin 1.7 (*)    GFR calc non Af Amer 42 (*)    GFR calc Af Amer 49 (*)    All other components within normal limits  I-STAT CHEM 8, ED - Abnormal; Notable for the following:    Sodium 159 (*)    Chloride 122 (*)    BUN 54 (*)    Creatinine, Ser 1.70 (*)    Glucose, Bld 139 (*)    All other components within normal limits  I-STAT TROPOININ, ED - Abnormal; Notable for the following:    Troponin i, poc 0.22 (*)    All other components within normal limits  ETHANOL  APTT  CK  URINE RAPID DRUG SCREEN, HOSP PERFORMED  URINALYSIS, ROUTINE W REFLEX MICROSCOPIC (NOT AT [facility])    Imaging Review Dg Pelvis 1-2 Views  07/01/2015   CLINICAL DATA:  80 year old male found on the floor.  Set  EXAM: PELVIS - 1-2 VIEW  COMPARISON:  None.  FINDINGS: There is no evidence of pelvic fracture or diastasis. No pelvic bone lesions are seen.  IMPRESSION: No acute findings.   Electronically Signed   By: Elgie Collard M.D.   On: 07/01/2015 21:15   Ct Head Wo Contrast  07/01/2015   CLINICAL DATA:  80 year old male found on the floor  EXAM: CT HEAD WITHOUT CONTRAST  CT CERVICAL SPINE WITHOUT CONTRAST  TECHNIQUE: Multidetector CT imaging of the head and cervical spine was performed following the standard protocol without intravenous contrast. Multiplanar CT image reconstructions of the cervical spine were also generated.  COMPARISON:  None.  FINDINGS: CT HEAD FINDINGS  There is slight prominence of the ventricles and sulci compatible with age-related volume loss. Mild periventricular and deep white matter hypodensities represent chronic microvascular ischemic changes. There is no intracranial hemorrhage. No mass effect or midline shift identified.  There is mild mucoperiosteal thickening of paranasal sinuses. The mastoid air cells are well aerated. There is opacification of the left external auditory  canal. Correlation with clinical exam recommended. Small right forehead and left occipital scalp screws noted. The calvarium is intact.  CT CERVICAL SPINE FINDINGS  There is no acute fracture or subluxation of the cervical spine.Multilevel degenerative changes. There is loss of disc space height at C7-T1.The odontoid and spinous processes are intact.There is normal anatomic alignment of the C1-C2 lateral masses. The visualized soft tissues appear unremarkable.  Left thyroid partially calcified hypodense nodule. Ultrasound may provide better evaluation. There is diffuse stranding of the subcutaneous soft tissues of the anterior neck.  IMPRESSION: No acute intracranial pathology.  No acute cervical spine fracture.   Electronically Signed   By: Elgie Collard M.D.   On: 07/01/2015 22:15   Ct Cervical Spine Wo Contrast  07/01/2015   CLINICAL DATA:  80 year old male found on the floor  EXAM: CT HEAD WITHOUT CONTRAST  CT CERVICAL SPINE WITHOUT CONTRAST  TECHNIQUE: Multidetector CT imaging of the head and cervical spine was performed following the standard protocol without intravenous contrast. Multiplanar CT image reconstructions of the cervical spine were also generated.  COMPARISON:  None.  FINDINGS: CT HEAD FINDINGS  There is slight prominence of the ventricles and sulci compatible with age-related volume loss. Mild periventricular and deep white matter hypodensities represent chronic microvascular ischemic changes. There is no intracranial hemorrhage. No mass effect or midline shift identified.  There is mild mucoperiosteal thickening of paranasal sinuses. The mastoid air cells are well aerated. There is opacification of the left external auditory canal. Correlation with clinical exam recommended. Small right forehead and left occipital scalp screws noted. The calvarium is intact.  CT CERVICAL SPINE FINDINGS  There is no acute fracture or subluxation of the cervical spine.Multilevel degenerative changes. There is  loss of disc space height at C7-T1.The odontoid and spinous processes are intact.There is normal anatomic alignment of the C1-C2 lateral masses. The visualized soft tissues appear unremarkable.  Left thyroid partially calcified hypodense nodule. Ultrasound may provide better evaluation. There is diffuse stranding of the subcutaneous soft tissues of the anterior neck.  IMPRESSION: No acute intracranial pathology.  No acute cervical spine fracture.   Electronically Signed   By: Elgie Collard  M.D.   On: 07/01/2015 22:15   Dg Chest Portable 1 View  07/01/2015   CLINICAL DATA:  80 year old male found on the floor  EXAM: PORTABLE CHEST - 1 VIEW  COMPARISON:  None.  FINDINGS: The heart size and mediastinal contours are within normal limits. Both lungs are clear. The visualized skeletal structures are unremarkable.  IMPRESSION: No active disease.   Electronically Signed   By: Elgie Collard M.D.   On: 07/01/2015 21:16   Medications  sodium chloride 0.9 % bolus 1,000 mL (1,000 mLs Intravenous New Bag/Given 07/01/15 2102)     MDM   Final diagnoses:  Hypernatremia  Acute renal insufficiency  Decubitus ulcer    Pt's CT does not show an acute stroke.  Still concerning for that possibility.  May need MRI scanning if symptoms dont improve.  Pt's altered state may be related to his hypernatremia.  This may be a result of him being down on the ground versus the cause of his fall.   Will continue with fluid hydration.  Admit to the hospital for further treatment.  Initial bolus with saline.  Will change fluid over to d5w    Linwood Dibbles, MD 07/01/15 2224

## 2015-07-01 NOTE — ED Notes (Signed)
Patient transported to CT 

## 2015-07-01 NOTE — ED Notes (Signed)
Was not able I&O pt.  Informed charge Charity fundraiserN.

## 2015-07-01 NOTE — ED Notes (Signed)
[facility] 142

## 2015-07-01 NOTE — ED Notes (Signed)
Per EMS [place], family has not seen pt since Friday, family went by today to see pt since they had not heard from him and found him on L side on floor in bathroom, pt is non-verbal at this time, hx of alzheimer's and dementia, Pt has multiple pressure sores on L side and R inner knee, R wrist 20G.

## 2015-07-02 ENCOUNTER — Encounter (HOSPITAL_COMMUNITY): Payer: Self-pay | Admitting: Internal Medicine

## 2015-07-02 ENCOUNTER — Inpatient Hospital Stay (HOSPITAL_COMMUNITY): Payer: Commercial Managed Care - HMO

## 2015-07-02 DIAGNOSIS — F0390 Unspecified dementia without behavioral disturbance: Secondary | ICD-10-CM | POA: Diagnosis present

## 2015-07-02 DIAGNOSIS — R4182 Altered mental status, unspecified: Secondary | ICD-10-CM | POA: Diagnosis present

## 2015-07-02 DIAGNOSIS — I10 Essential (primary) hypertension: Secondary | ICD-10-CM | POA: Diagnosis present

## 2015-07-02 LAB — CBC WITH DIFFERENTIAL/PLATELET
BASOS PCT: 0 % (ref 0–1)
Basophils Absolute: 0 10*3/uL (ref 0.0–0.1)
EOS ABS: 0 10*3/uL (ref 0.0–0.7)
EOS PCT: 0 % (ref 0–5)
HEMATOCRIT: 42.7 % (ref 39.0–52.0)
Hemoglobin: 13.6 g/dL (ref 13.0–17.0)
LYMPHS ABS: 1.4 10*3/uL (ref 0.7–4.0)
LYMPHS PCT: 9 % — AB (ref 12–46)
MCH: 30.4 pg (ref 26.0–34.0)
MCHC: 31.9 g/dL (ref 30.0–36.0)
MCV: 95.5 fL (ref 78.0–100.0)
MONOS PCT: 7 % (ref 3–12)
Monocytes Absolute: 1 10*3/uL (ref 0.1–1.0)
Neutro Abs: 12.6 10*3/uL — ABNORMAL HIGH (ref 1.7–7.7)
Neutrophils Relative %: 84 % — ABNORMAL HIGH (ref 43–77)
Platelets: 150 10*3/uL (ref 150–400)
RBC: 4.47 MIL/uL (ref 4.22–5.81)
RDW: 13.9 % (ref 11.5–15.5)
WBC: 15 10*3/uL — ABNORMAL HIGH (ref 4.0–10.5)

## 2015-07-02 LAB — COMPREHENSIVE METABOLIC PANEL
ALBUMIN: 3 g/dL — AB (ref 3.5–5.0)
ALK PHOS: 45 U/L (ref 38–126)
ALT: 48 U/L (ref 17–63)
AST: 57 U/L — ABNORMAL HIGH (ref 15–41)
Anion gap: 6 (ref 5–15)
BUN: 58 mg/dL — AB (ref 6–20)
CO2: 29 mmol/L (ref 22–32)
CREATININE: 1.32 mg/dL — AB (ref 0.61–1.24)
Calcium: 9.4 mg/dL (ref 8.9–10.3)
Chloride: 119 mmol/L — ABNORMAL HIGH (ref 101–111)
GFR calc Af Amer: 58 mL/min — ABNORMAL LOW (ref >60)
GFR calc non Af Amer: 50 mL/min — ABNORMAL LOW (ref >60)
Glucose, Bld: 240 mg/dL — ABNORMAL HIGH (ref 65–99)
POTASSIUM: 3.7 mmol/L (ref 3.5–5.1)
SODIUM: 154 mmol/L — AB (ref 135–145)
TOTAL PROTEIN: 6.1 g/dL — AB (ref 6.5–8.1)
Total Bilirubin: 1.5 mg/dL — ABNORMAL HIGH (ref 0.3–1.2)

## 2015-07-02 LAB — MRSA PCR SCREENING: MRSA by PCR: NEGATIVE

## 2015-07-02 LAB — URINALYSIS, ROUTINE W REFLEX MICROSCOPIC
GLUCOSE, UA: NEGATIVE mg/dL
KETONES UR: 15 mg/dL — AB
Leukocytes, UA: NEGATIVE
NITRITE: NEGATIVE
PH: 5.5 (ref 5.0–8.0)
Protein, ur: 100 mg/dL — AB
Specific Gravity, Urine: 1.025 (ref 1.005–1.030)
Urobilinogen, UA: 1 mg/dL (ref 0.0–1.0)

## 2015-07-02 LAB — RAPID URINE DRUG SCREEN, HOSP PERFORMED
Amphetamines: NOT DETECTED
BENZODIAZEPINES: NOT DETECTED
Barbiturates: NOT DETECTED
Cocaine: NOT DETECTED
Opiates: NOT DETECTED
TETRAHYDROCANNABINOL: NOT DETECTED

## 2015-07-02 LAB — URINE MICROSCOPIC-ADD ON

## 2015-07-02 LAB — TROPONIN I
TROPONIN I: 0.64 ng/mL — AB (ref <0.031)
Troponin I: 0.54 ng/mL (ref <0.031)

## 2015-07-02 MED ORDER — DEXTROSE 5 % IV SOLN
INTRAVENOUS | Status: DC
  Administered 2015-07-02: 03:00:00 via INTRAVENOUS

## 2015-07-02 MED ORDER — ENOXAPARIN SODIUM 40 MG/0.4ML ~~LOC~~ SOLN
40.0000 mg | SUBCUTANEOUS | Status: DC
  Administered 2015-07-02 – 2015-07-08 (×7): 40 mg via SUBCUTANEOUS
  Filled 2015-07-02 (×9): qty 0.4

## 2015-07-02 MED ORDER — ENOXAPARIN SODIUM 30 MG/0.3ML ~~LOC~~ SOLN: 30.0000 mg | SUBCUTANEOUS | Status: DC

## 2015-07-02 MED ORDER — ASPIRIN EC 81 MG PO TBEC
81.0000 mg | DELAYED_RELEASE_TABLET | Freq: Every day | ORAL | Status: DC
  Administered 2015-07-02 – 2015-07-05 (×3): 81 mg via ORAL
  Filled 2015-07-02 (×9): qty 1

## 2015-07-02 MED ORDER — DEXTROSE-NACL 5-0.9 % IV SOLN: INTRAVENOUS | Status: DC

## 2015-07-02 MED ORDER — DEXTROSE 5 % IV SOLN
INTRAVENOUS | Status: DC
  Administered 2015-07-02 – 2015-07-09 (×20): via INTRAVENOUS

## 2015-07-02 MED ORDER — FAMOTIDINE IN NACL 20-0.9 MG/50ML-% IV SOLN
20.0000 mg | Freq: Two times a day (BID) | INTRAVENOUS | Status: DC
  Administered 2015-07-02 – 2015-07-08 (×14): 20 mg via INTRAVENOUS
  Filled 2015-07-02 (×16): qty 50

## 2015-07-02 NOTE — Progress Notes (Signed)
07/02/15  0945  Left Hip 6.5cm x 4.5 cm Eschar- Foam dressing applied Left Hip Skin Tear- Foam dressing applied Left Elbow 5cm x 2.5 cm- Foam dressing applied Left Posterior Arm 4cm x 2.5cm- Foam dressing applied Left Face 4.5cm x 3.5cm Eschar Right Knee 4cm x 2.5cm- Foam dressing applied Left Inner Knee 3.5cm x 2cm- Foam dressing applied Left Knee Posterior 3.5cm x 2.5cm- Foam dressing applied Left Knee 6cm x 5cm Eschar- Foam dressing applied Sacral Foam dressing applied for protection

## 2015-07-02 NOTE — ED Notes (Addendum)
Daughter: Shauna HughMichele Graham  [phone]  Son: Christain SacramentoSteve Dawkins  [phone]

## 2015-07-02 NOTE — Progress Notes (Signed)
CRITICAL VALUE ALERT  Critical value received:  Troponin 0.54  Date of notification:  07/02/15  Time of notification:  1820  Critical value read back: Yes  Nurse who received alert:  Pam  MD notified (1st page):  Cena BentonVega  Time of first page:  1820  MD notified (2nd page):  Time of second page:  Responding MD:    Time MD responded:

## 2015-07-02 NOTE — ED Notes (Signed)
Patient transported to MRI 

## 2015-07-02 NOTE — Progress Notes (Signed)
Utilization review completed.  

## 2015-07-02 NOTE — H&P (Signed)
[facility] History and Physical  Corey SiJames D Ratchford ZOX:[id number] DOB: 12-30-1934 DOA: 07/01/2015  Referring physician: Dr. Lynelle DoctorKnapp PCP: Leotis ShamesSingh,Jasmine, MD   Chief Complaint: Unresponsiveness   HPI: Corey Knight is a 80 y.o. male with below past medical history who was brought to the emergency department after being found lying down in his bathroom. I'll tentatively for at least 2 days. Patient has a history of dementia, but is able to take care of himself and lives on a trailer which does not have air conditioning. Family members got concerned when they were unable to communicate with him for two days and decided to go to his trailer home. He was unable to speak and has been non-verbal since arrival. The history has been provided by Dr. Lynelle DoctorKnapp and the patient's record.    Review of Systems:  Unable to obtain due to the patient's mental status.   Past Medical History  Diagnosis Date  . Dementia   . Alzheimer disease   . Hypertension   . TIA (transient ischemic attack)    History reviewed. No pertinent past surgical history. Social History:  has no tobacco, alcohol, and drug history on file.  No Known Allergies  History reviewed. No pertinent family history.  Prior to Admission medications   Medication Sig Start Date End Date Taking? Authorizing Provider  amLODipine (NORVASC) 10 MG tablet Take 10 mg by mouth daily.   Yes Historical Provider, MD  donepezil (ARICEPT) 10 MG tablet Take 10 mg by mouth at bedtime.   Yes Historical Provider, MD  hydrALAZINE (APRESOLINE) 25 MG tablet Take 50 mg by mouth Nightly.    Yes Historical Provider, MD  memantine (NAMENDA) 10 MG tablet Take 10 mg by mouth 2 (two) times daily.   Yes Historical Provider, MD  metoprolol succinate (TOPROL-XL) 50 MG 24 hr tablet Take 25 mg by mouth daily. Take with or immediately following a meal.   Yes Historical Provider, MD  simvastatin (ZOCOR) 10 MG tablet Take 10 mg by mouth daily.   Yes Historical Provider, MD    levofloxacin (LEVAQUIN) 500 MG tablet Take 1 tablet (500 mg total) by mouth daily. 05/16/15   Orson ApeMichael R Wolff, MD  losartan-hydrochlorothiazide (HYZAAR) 100-12.5 MG per tablet Take 1 tablet by mouth daily.    Historical Provider, MD  NUCYNTA 50 MG TABS tablet Take 1 tablet (50 mg total) by mouth every 6 (six) hours as needed. 1 TO 2 TABS Q 6 HOURS PRN PAIN 05/16/15   Orson ApeMichael R Wolff, MD  ondansetron (ZOFRAN ODT) 8 MG disintegrating tablet Take 1 tablet (8 mg total) by mouth every 6 (six) hours as needed for nausea or vomiting. 05/16/15   Orson ApeMichael R Wolff, MD  tamsulosin (FLOMAX) 0.4 MG CAPS capsule Take 1 capsule (0.4 mg total) by mouth daily. 05/16/15   Orson ApeMichael R Wolff, MD   Physical Exam: Filed Vitals:   07/02/15 0000 07/02/15 0015 07/02/15 0045 07/02/15 0100  BP: 132/86 135/83 127/77 149/99  Pulse: 76 71 68 70  Temp: 98.9 F (37.2 C)     TempSrc: Rectal     Resp: 20 27 19 17   SpO2: 100% 98% 98% 100%    Wt Readings from Last 3 Encounters:  05/16/15 70.761 kg (156 lb)    General:  Appears calm and comfortable, but was arousable. non-verbal. Eyes: PERRL, normal lids, irises & conjunctiva ENT: grossly normal hearing, lips & tongue were dry Neck: no LAD, masses or thyromegaly Cardiovascular: RRR, no m/r/g. No LE edema. Telemetry: SR,  no arrhythmias  Respiratory: CTA bilaterally, no w/r/r. Normal respiratory effort. Abdomen: soft, ntnd Skin: There were pressure ulcers on patient's the left face and left sided extremities. Musculoskeletal: grossly normal tone BUE/BLE Psychiatric: grossly normal mood and affect, speech fluent and appropriate Neurologic: grossly non-focal.          Labs on Admission:  Basic Metabolic Panel:  Recent Labs Lab 07/01/15 2103 07/01/15 2107  NA 160 159  K 3.9 3.7  CL 123 122  CO2 28  --   GLUCOSE 142 139  BUN 56 54  CREATININE 1.53 1.7  CALCIUM 10.5  --    Liver Function Tests:  Recent Labs Lab 07/01/15 2103  AST 54  ALT 47  ALKPHOS 52   BILITOT 1.7  PROT 7.4  ALBUMIN 3.6   No results for input(s): LIPASE, AMYLASE in the last 168 hours. No results for input(s): AMMONIA in the last 168 hours. CBC:  Recent Labs Lab 07/01/15 2103 07/01/15 2107  WBC 16.5  --   NEUTROABS 14.0  --   HGB 16.0 16.7  HCT 50.9 49.0  MCV 95.9  --   PLT 187  --    Cardiac Enzymes:  Recent Labs Lab 07/01/15 2103  CKTOTAL 283    BNP (last 3 results) No results for input(s): BNP in the last 8760 hours.  ProBNP (last 3 results) No results for input(s): PROBNP in the last 8760 hours.  CBG: No results for input(s): GLUCAP in the last 168 hours.  Radiological Exams on Admission: Dg Pelvis 1-2 Views  07/01/2015   CLINICAL DATA:  80 year old male found on the floor.  Set  EXAM: PELVIS - 1-2 VIEW  COMPARISON:  None.  FINDINGS: There is no evidence of pelvic fracture or diastasis. No pelvic bone lesions are seen.  IMPRESSION: No acute findings.   Electronically Signed   By: Elgie Collard M.D.   On: 07/01/2015 21:15   Ct Head Wo Contrast  07/01/2015   CLINICAL DATA:  80 year old male found on the floor  EXAM: CT HEAD WITHOUT CONTRAST  CT CERVICAL SPINE WITHOUT CONTRAST  TECHNIQUE: Multidetector CT imaging of the head and cervical spine was performed following the standard protocol without intravenous contrast. Multiplanar CT image reconstructions of the cervical spine were also generated.  COMPARISON:  None.  FINDINGS: CT HEAD FINDINGS  There is slight prominence of the ventricles and sulci compatible with age-related volume loss. Mild periventricular and deep white matter hypodensities represent chronic microvascular ischemic changes. There is no intracranial hemorrhage. No mass effect or midline shift identified.  There is mild mucoperiosteal thickening of paranasal sinuses. The mastoid air cells are well aerated. There is opacification of the left external auditory canal. Correlation with clinical exam recommended. Small right forehead and  left occipital scalp screws noted. The calvarium is intact.  CT CERVICAL SPINE FINDINGS  There is no acute fracture or subluxation of the cervical spine.Multilevel degenerative changes. There is loss of disc space height at C7-T1.The odontoid and spinous processes are intact.There is normal anatomic alignment of the C1-C2 lateral masses. The visualized soft tissues appear unremarkable.  Left thyroid partially calcified hypodense nodule. Ultrasound may provide better evaluation. There is diffuse stranding of the subcutaneous soft tissues of the anterior neck.  IMPRESSION: No acute intracranial pathology.  No acute cervical spine fracture.   Electronically Signed   By: Elgie Collard M.D.   On: 07/01/2015 22:15   Ct Cervical Spine Wo Contrast  07/01/2015   CLINICAL DATA:  80 year old  male found on the floor  EXAM: CT HEAD WITHOUT CONTRAST  CT CERVICAL SPINE WITHOUT CONTRAST  TECHNIQUE: Multidetector CT imaging of the head and cervical spine was performed following the standard protocol without intravenous contrast. Multiplanar CT image reconstructions of the cervical spine were also generated.  COMPARISON:  None.  FINDINGS: CT HEAD FINDINGS  There is slight prominence of the ventricles and sulci compatible with age-related volume loss. Mild periventricular and deep white matter hypodensities represent chronic microvascular ischemic changes. There is no intracranial hemorrhage. No mass effect or midline shift identified.  There is mild mucoperiosteal thickening of paranasal sinuses. The mastoid air cells are well aerated. There is opacification of the left external auditory canal. Correlation with clinical exam recommended. Small right forehead and left occipital scalp screws noted. The calvarium is intact.  CT CERVICAL SPINE FINDINGS  There is no acute fracture or subluxation of the cervical spine.Multilevel degenerative changes. There is loss of disc space height at C7-T1.The odontoid and spinous processes are  intact.There is normal anatomic alignment of the C1-C2 lateral masses. The visualized soft tissues appear unremarkable.  Left thyroid partially calcified hypodense nodule. Ultrasound may provide better evaluation. There is diffuse stranding of the subcutaneous soft tissues of the anterior neck.  IMPRESSION: No acute intracranial pathology.  No acute cervical spine fracture.   Electronically Signed   By: Elgie Collard M.D.   On: 07/01/2015 22:15   Dg Chest Portable 1 View  07/01/2015   CLINICAL DATA:  80 year old male found on the floor  EXAM: PORTABLE CHEST - 1 VIEW  COMPARISON:  None.  FINDINGS: The heart size and mediastinal contours are within normal limits. Both lungs are clear. The visualized skeletal structures are unremarkable.  IMPRESSION: No active disease.   Electronically Signed   By: Elgie Collard M.D.   On: 07/01/2015 21:16   Dg Humerus Left  07/01/2015   CLINICAL DATA:  80 year old male status post fall with abrasion and bruised the proximal humerus  EXAM: LEFT HUMERUS - 2+ VIEW  COMPARISON:  None.  FINDINGS: There is no evidence of fracture or other focal bone lesions. Soft tissues are unremarkable.  IMPRESSION: No fracture of the left humerus.   Electronically Signed   By: Elgie Collard M.D.   On: 07/01/2015 23:39    EKG: Independently reviewed.  Assessment/Plan Active Problems:   Hypernatremia   Hypertension   Change in mental status   Dementia  Will admit to stepdown for closer monitoring. Continue rehydration with dextrose 5% water. Check sodium and other labs periodically. Most lab abnormalities seem to be from Hemoconcentration secondary to water loss. Neuro checks every 4 hours. Wound care. Get MRI in AM. Consult neurology in AM if no improvement after sodium decreases.  GI and DVT prophylaxis.    Code Status: Full code DVT Prophylaxis: SCDs and pharmacological. Family Communication: There were no family members available at the time of my examination of  the patient, but this is the contact information that appears on the patient's facesheet.  Tana [place] Daughter [phone]  [phone]  Disposition Plan: Home with home health VS skilled nursing facility  Time spent: 60 minutes  Bobette Mo [facility] Pager [phone]

## 2015-07-02 NOTE — Progress Notes (Signed)
Patient was seen and evaluated earlier the same by my associate. Please refer to H&P for details regarding assessment and plan. Family reports the patient may have been down for approximately 2 days. He also reports that he is responding more today. Patient at baseline per family has Alzheimer's and is currently living by himself in a trailer home.  MRI of brain obtained which reports no acute intracranial infarct or other abnormality identified based on report. Given improvement in neurological condition with IV fluid rehydration and negative MRI will not consult neurology at this moment.  Gen.: Patient in no acute distress, awake and alert. Cardiovascular: S1 and S2 present, no rubs Pulmonary: No increased work of breathing, no wheezes, equal chest rise Skin: Patient has pressure ulcer at left side of face  Will plan on reassessing next a.m. unless there is an acute medical issue requiring immediate attention.  Jinnie Onley, [employer]

## 2015-07-03 ENCOUNTER — Encounter (HOSPITAL_COMMUNITY): Payer: Self-pay | Admitting: *Deleted

## 2015-07-03 LAB — COMPREHENSIVE METABOLIC PANEL
ALK PHOS: 48 U/L (ref 38–126)
ALT: 64 U/L — ABNORMAL HIGH (ref 17–63)
ANION GAP: 7 (ref 5–15)
AST: 52 U/L — AB (ref 15–41)
Albumin: 2.5 g/dL — ABNORMAL LOW (ref 3.5–5.0)
BUN: 44 mg/dL — ABNORMAL HIGH (ref 6–20)
CHLORIDE: 113 mmol/L — AB (ref 101–111)
CO2: 28 mmol/L (ref 22–32)
Calcium: 9.2 mg/dL (ref 8.9–10.3)
Creatinine, Ser: 1.11 mg/dL (ref 0.61–1.24)
GFR calc Af Amer: >60 mL/min (ref >60)
GFR calc non Af Amer: >60 mL/min (ref >60)
Glucose, Bld: 181 mg/dL — ABNORMAL HIGH (ref 65–99)
Potassium: 3.7 mmol/L (ref 3.5–5.1)
Sodium: 148 mmol/L — ABNORMAL HIGH (ref 135–145)
Total Bilirubin: 1 mg/dL (ref 0.3–1.2)
Total Protein: 5.3 g/dL — ABNORMAL LOW (ref 6.5–8.1)

## 2015-07-03 LAB — CBC WITH DIFFERENTIAL/PLATELET
BASOS ABS: 0 10*3/uL (ref 0.0–0.1)
Basophils Relative: 0 % (ref 0–1)
EOS PCT: 1 % (ref 0–5)
Eosinophils Absolute: 0.1 10*3/uL (ref 0.0–0.7)
HCT: 40.8 % (ref 39.0–52.0)
HEMOGLOBIN: 12.9 g/dL — AB (ref 13.0–17.0)
Lymphocytes Relative: 16 % (ref 12–46)
Lymphs Abs: 2.2 10*3/uL (ref 0.7–4.0)
MCH: 30.6 pg (ref 26.0–34.0)
MCHC: 31.6 g/dL (ref 30.0–36.0)
MCV: 96.7 fL (ref 78.0–100.0)
MONO ABS: 0.8 10*3/uL (ref 0.1–1.0)
Monocytes Relative: 6 % (ref 3–12)
NEUTROS ABS: 10.4 10*3/uL — AB (ref 1.7–7.7)
Neutrophils Relative %: 77 % (ref 43–77)
Platelets: 135 10*3/uL — ABNORMAL LOW (ref 150–400)
RBC: 4.22 MIL/uL (ref 4.22–5.81)
RDW: 13.9 % (ref 11.5–15.5)
WBC: 13.6 10*3/uL — ABNORMAL HIGH (ref 4.0–10.5)

## 2015-07-03 LAB — URINE CULTURE: Culture: NO GROWTH

## 2015-07-03 NOTE — Clinical Social Work Note (Signed)
Clinical Social Work Assessment  Patient Details  Name: Corey Knight MRN: [id number] Date of Birth: May 26, 1935  Date of referral:  07/03/15               Reason for consult:  Facility Placement                Permission sought to share information with:  [facility]cean[facility] Contact Representative Permission granted to share information::  Yes, Verbal Permission Granted  Name::        Agency::     Relationship::     Contact Information:     Housing/Transportation Living arrangements for the past 2 months:  Mobile Home Source of Information:  Adult Children Patient Interpreter Needed:  None Criminal Activity/Legal Involvement Pertinent to Current Situation/Hospitalization:  No - Comment as needed Significant Relationships:  None Lives with:  Self Do you feel safe going back to the place where you live?  No Need for family participation in patient care:  Yes (Comment)  Care giving concerns:  CSW received consult for SNF placement, reviewed PT evaluation recommending SNF as well.    Social Worker assessment / plan:  CSW spoke with patient's daughter, Corey Knight at bedside to confirm that they are agreeable with plan for SNF at discharge.   Employment status:  Retired Database administratornsurance information:  Managed Medicare PT Recommendations:  Skilled Nursing Facility Information / Referral to community resources:  Skilled Nursing Facility  Patient/Family's Response to care:  Patient's daughter informed CSW that patient had been to [facility] about 2 years ago and would prefer that he return there if possible. CSW confirmed with Corey Romneyoug at [facility] that they would be able to offer a bed.   Patient/Family's Understanding of and Emotional Response to Diagnosis, Current Treatment, and Prognosis:  Patient's daughter was concerned about him returning home (/RV) at discharge - patient is not safe to live alone, [facility] or [facility]F.   Emotional Assessment Appearance:  Appears stated  age, Malodorous, Disheveled Attitude/Demeanor/Rapport:    Affect (typically observed):    Orientation:  Oriented to Self Alcohol / Substance use:    Psych involvement (Current and /or in the community):     Discharge Needs  Concerns to be addressed:    Readmission within the last 30 days:    Current discharge risk:    Barriers to Discharge:      Corey Knight, Corey Dutil F, LCSW 07/03/2015, 2:36 PM

## 2015-07-03 NOTE — Clinical Social Work Placement (Signed)
   CLINICAL SOCIAL WORK PLACEMENT  NOTE  Date:  07/03/2015  Patient Details  Name: Corey Knight MRN: [id number] Date of Birth: 1935-10-26  Clinical Social Work is seeking post-discharge placement for this patient at the Skilled  Nursing Facility level of care (*CSW will initial, date and re-position this form in  chart as items are completed):  Yes   Patient/family provided with [facility] Clinical Social Work Department's list of facilities offering this level of care within the geographic area requested by the patient (or if unable, by the patient's family).  Yes   Patient/family informed of their freedom to choose among providers that offer the needed level of care, that participate in Medicare, Medicaid or managed care program needed by the patient, have an available bed and are willing to accept the patient.  Yes   Patient/family informed of [facility]'s ownership interest in [facility] and [facility], as well as of the fact that they are under no obligation to receive care at these facilities.  PASRR submitted to EDS on 07/03/15     PASRR number received on 07/03/15     Existing PASRR number confirmed on       FL2 transmitted to all facilities in geographic area requested by pt/family on 07/03/15     FL2 transmitted to all facilities within larger geographic area on       Patient informed that his/her managed care company has contracts with or will negotiate with certain facilities, including the following:        Yes   Patient/family informed of bed offers received.  Patient chooses bed at [facility]     Physician recommends and patient chooses bed at      Patient to be transferred to [facility] on  .  Patient to be transferred to facility by       Patient family notified on   of transfer.  Name of family member notified:        PHYSICIAN       Additional Comment:     _______________________________________________ [name], Weslynn Ke F, LCSW 07/03/2015, 2:38 PM

## 2015-07-03 NOTE — Clinical Documentation Improvement (Signed)
Per ED Prov Note: "Decubitus ulcer" Per Progress Note 07/02/2015: "Skin: Patient has pressure ulcer at left side of face"  Please document pressure ulcer stage in progress notes.  Thank you, Alesia RichardsAnne Christoph Copelan, RN CDS [facility] Health Information Management [email or web address] [phone]

## 2015-07-03 NOTE — Clinical Documentation Improvement (Signed)
Lab has reported the following creatinine values this admission: 1.7, 1.53, 1.32, 1.11 Per ED Provider Note 7/11: "Acute renal insufficiency"  Can this patient's kidney function possibly be further specified, for example:   Acute Renal Failure/Acute Kidney Injury Acute Tubular Necrosis Acute Renal Cortical Necrosis Acute Renal Medullary Necrosis Acute on Chronic Renal Failure Chronic Renal Failure Other Condition Cannot Clinically Determine    Thank you, Alesia RichardsAnne Errika Narvaiz, RN CDS [facility] Health Information Management [email or web address] [phone]

## 2015-07-03 NOTE — Care Management Important Message (Signed)
Important Message  Patient Details  Name: Corey Knight MRN: [id number] Date of Birth: [birth year]   Medicare Important Message Given:  Yes-second notification given    Haskell FlirtJamison, Meelah Tallo 07/03/2015, 10:51 AMImportant Message  Patient Details  Name: Corey Knight MRN: [id number] Date of Birth: [birth year]   Medicare Important Message Given:  Yes-second notification given    Haskell FlirtJamison, Taygen Acklin 07/03/2015, 10:51 AM

## 2015-07-03 NOTE — Evaluation (Signed)
Physical Therapy Evaluation Patient Details Name: Corey Knight MRN: [id number] DOB: January 15, 1935 Today's Date: 07/03/2015   History of Present Illness  80 yo male admiited found on floor at home. Admitted with multiple abrasions/wounds and hypernatremia. Hx of Alz, TIA, HTN.   Clinical Impression  Bed level eval only. Total assist +2 for bed mobility. Pt not following commands. Verbal responses are garbled. Pt resistive to movement at times during session. Recommend SNF. Will follow on a trial basis to assess pt's ability to participate with therapy in acute setting.     Follow Up Recommendations SNF;Supervision/Assistance - 24 hour    Equipment Recommendations       Recommendations for Other Services OT consult     Precautions / Restrictions Precautions Precautions: Fall Restrictions Weight Bearing Restrictions: No      Mobility  Bed Mobility Overal bed mobility: Needs Assistance Bed Mobility: Rolling Rolling: Total assist;+2 for physical assistance;+2 for safety/equipment         General bed mobility comments: Rolled to L and R side with +2 total assist. Utilized bepad to aid with positioning  Transfers                 General transfer comment: NT-pt unable  Ambulation/Gait                Stairs            Wheelchair Mobility    Modified Rankin (Stroke Patients Only)       Balance                                             Pertinent Vitals/Pain Pain Assessment: Faces Faces Pain Scale: Hurts little more Pain Location: appears generalized with movement Pain Descriptors / Indicators: Grimacing;Moaning Pain Intervention(s): Repositioned    Home Living Family/patient expects to be discharged to:: Skilled nursing facility                 Additional Comments: per chart, pt lilved in [place] woods and was able to take care of himself    Prior Function                 Hand Dominance         Extremity/Trunk Assessment   Upper Extremity Assessment: Difficult to assess due to impaired cognition           Lower Extremity Assessment: Difficult to assess due to impaired cognition         Communication   Communication: Expressive difficulties;Receptive difficulties  Cognition Arousal/Alertness: Lethargic Behavior During Therapy:  (moans/resistive with attempts at mobilizing) Overall Cognitive Status: No family/caregiver present to determine baseline cognitive functioning                      General Comments      Exercises        Assessment/Plan    PT Assessment Patient needs continued PT services  PT Diagnosis Altered mental status;Generalized weakness;Difficulty walking   PT Problem List Decreased strength;Decreased activity tolerance;Decreased balance;Decreased mobility;Decreased cognition;Decreased knowledge of use of DME;Decreased safety awareness;Decreased knowledge of precautions;Pain;Decreased skin integrity  PT Treatment Interventions Gait training;DME instruction;Functional mobility training;Therapeutic activities;Therapeutic exercise;Patient/family education;Balance training   PT Goals (Current goals can be found in the Care Plan section) Acute Rehab PT Goals Patient Stated Goal: pt unable to state PT Goal Formulation: Patient  unable to participate in goal setting Time For Goal Achievement: 07/17/15 Potential to Achieve Goals: Fair    Frequency Min 2X/week   Barriers to discharge        Co-evaluation               End of Session   Activity Tolerance: Patient limited by lethargy (Limited by cognition) Patient left: in bed;with call bell/phone within reach;with nursing/sitter in room           Time: 0952-1001 PT Time Calculation (min) (ACUTE ONLY): 9 min   Charges:   PT Evaluation $Initial PT Evaluation Tier I: 1 Procedure     PT G Codes:        Rebeca AlertJannie Adael Culbreath, MPT Pager: [phone]

## 2015-07-03 NOTE — Progress Notes (Signed)
[facility]                                                                              Patient Demographics  Corey Knight, is a 80 y.o. male, DOB - 04-21-1935, WUJ:[id number]  Admit date - 07/01/2015   Admitting Physician Bobette Mo, MD  Outpatient Primary MD for the patient is Singh,Jasmine, MD  LOS - 2   Chief Complaint  Patient presents with  . Altered Mental Status  . Fall      HPI on 07/02/2015 by Dr. Ernestene Kiel Corey Knight is a 80 y.o. male with below past medical history who was brought to the emergency department after being found lying down in his bathroom. I'll tentatively for at least 2 days. Patient has a history of dementia, but is able to take care of himself and lives on a trailer which does not have air conditioning. Family members got concerned when they were unable to communicate with him for two days and decided to go to his trailer home. He was unable to speak and has been non-verbal since arrival. The history has been provided by Dr. Lynelle Doctor and the patient's record.   Assessment & Plan   Acute encephalopathy -Likely multifactorial secondary to underlying dementia versus hypernatremia -Blood cultures negative to date -MRI of the head negative for acute or cranial infarcts or other abnormality; moderate generalized cerebral atrophy -Supposedly patient was found down and unresponsive at home possibly for 2 days -Toxicology screen negative  Hypernatremia -Slightly improved, was 154 admission, currently 148 -Possibly secondary to dehydration  Leukocytosis -UA/CXR  negative for infection -Blood culture showed no growth to date  -Trending downward -Possibly reactive  Elevated troponin -Troponin trending downward -Will obtain EKG, continue tele  Dementia -Namenda, Aricept currently held  Acute kidney injury -Likely secondary dehydration -Creatinine 1.7 upon admission, currently 1.11 -Continue to monitor  Hyperglycemia  -No known  history of diabetes   Hypertension  -Amlodipine, hydralazine, Hyzaar, metoprolol held -Currently normotensive, will add medications if needed  Hyperlipidemia  -Statin held  Code Status: Full  Family Communication: None at bedside  Disposition Plan: Admitted  Time Spent in minutes   30 minutes  Procedures  None  Consults   None  DVT Prophylaxis  Lovenox  Lab Results  Component Value Date   PLT 135* 07/03/2015    Medications  Scheduled Meds: . aspirin EC  81 mg Oral Daily  . enoxaparin (LOVENOX) injection  40 mg Subcutaneous Q24H  . famotidine (PEPCID) IV  20 mg Intravenous Q12H   Continuous Infusions: . dextrose 150 mL/hr at 07/03/15 1208   PRN Meds:.  Antibiotics   Anti-infectives    None      Subjective:   [name] seen and examined today.  Patient responds minimally to questions, does not follow commands. Does have history of dementia.    Objective:   Filed Vitals:   07/02/15 1800 07/02/15 1903 07/02/15 2202 07/03/15 0456  BP: 106/79 129/72 116/69 97/65  Pulse: 35 62 72 63  Temp:  98 F (36.7 C) 98.5 F (36.9 C) 98.3 F (36.8 C)  TempSrc:  Axillary Axillary Axillary  Resp: 21 20 20  20  Height:  5\' 8"  (1.727 m)    Weight:  63 kg (138 lb 14.2 oz)    SpO2: 100% 98% 99% 99%    Wt Readings from Last 3 Encounters:  07/02/15 63 kg (138 lb 14.2 oz)  05/16/15 70.761 kg (156 lb)  05/11/15 74.844 kg (165 lb)     Intake/Output Summary (Last 24 hours) at 07/03/15 1257 Last data filed at 07/03/15 0252  Gross per 24 hour  Intake    800 ml  Output    501 ml  Net    299 ml    Exam  General: Well developed, well nourished, NAD, appears stated age  HEENT: [place], abrasion noted on left side of the face,  mucous membranes moist.  minimal discharge from eyes. No scleral icterus noted   Cardiovascular: S1 S2 auscultated, no rubs, murmurs or gallops. Regular rate and rhythm.  Respiratory: Clear to auscultation bilaterally with equal chest  rise  Abdomen: Soft, nontender, nondistended, + bowel sounds  Extremities: warm dry without cyanosis clubbing or edema  Neuro: Awake and alert to self only name. Does not follow commands.  Skin: Several areas of skin abrasions on the left side of the body including black eschar with decubitus wounds   Data Review   Micro Results Recent Results (from the past 240 hour(s))  Blood culture (routine x 2)     Status: None (Preliminary result)   Collection Time: 07/01/15 10:37 PM  Result Value Ref Range Status   Specimen Description BLOOD RIGHT HAND  Final   Special Requests BOTTLES DRAWN AEROBIC AND ANAEROBIC 3CC EA  Final   Culture   Final    NO GROWTH < 24 HOURS Performed at [facility]    Report Status PENDING  Incomplete  Blood culture (routine x 2)     Status: None (Preliminary result)   Collection Time: 07/01/15 10:40 PM  Result Value Ref Range Status   Specimen Description BLOOD LEFT ANTECUBITAL  Final   Special Requests BOTTLES DRAWN AEROBIC AND ANAEROBIC 5CC EA  Final   Culture   Final    NO GROWTH < 24 HOURS Performed at [facility]    Report Status PENDING  Incomplete  MRSA PCR Screening     Status: None   Collection Time: 07/02/15  9:08 AM  Result Value Ref Range Status   MRSA by PCR NEGATIVE NEGATIVE Final    Comment:        The GeneXpert MRSA Assay (FDA approved for NASAL specimens only), is one component of a comprehensive MRSA colonization surveillance program. It is not intended to diagnose MRSA infection nor to guide or monitor treatment for MRSA infections.     Radiology Reports Dg Pelvis 1-2 Views  07/01/2015   CLINICAL DATA:  80 year old male found on the floor.  Set  EXAM: PELVIS - 1-2 VIEW  COMPARISON:  None.  FINDINGS: There is no evidence of pelvic fracture or diastasis. No pelvic bone lesions are seen.  IMPRESSION: No acute findings.   Electronically Signed   By: Elgie Collard M.D.   On: 07/01/2015 21:15   Ct Head Wo  Contrast  07/01/2015   CLINICAL DATA:  80 year old male found on the floor  EXAM: CT HEAD WITHOUT CONTRAST  CT CERVICAL SPINE WITHOUT CONTRAST  TECHNIQUE: Multidetector CT imaging of the head and cervical spine was performed following the standard protocol without intravenous contrast. Multiplanar CT image reconstructions of the cervical spine were also generated.  COMPARISON:  None.  FINDINGS: CT HEAD FINDINGS  There is slight prominence of the ventricles and sulci compatible with age-related volume loss. Mild periventricular and deep white matter hypodensities represent chronic microvascular ischemic changes. There is no intracranial hemorrhage. No mass effect or midline shift identified.  There is mild mucoperiosteal thickening of paranasal sinuses. The mastoid air cells are well aerated. There is opacification of the left external auditory canal. Correlation with clinical exam recommended. Small right forehead and left occipital scalp screws noted. The calvarium is intact.  CT CERVICAL SPINE FINDINGS  There is no acute fracture or subluxation of the cervical spine.Multilevel degenerative changes. There is loss of disc space height at C7-T1.The odontoid and spinous processes are intact.There is normal anatomic alignment of the C1-C2 lateral masses. The visualized soft tissues appear unremarkable.  Left thyroid partially calcified hypodense nodule. Ultrasound may provide better evaluation. There is diffuse stranding of the subcutaneous soft tissues of the anterior neck.  IMPRESSION: No acute intracranial pathology.  No acute cervical spine fracture.   Electronically Signed   By: Elgie CollardArash  Radparvar M.D.   On: 07/01/2015 22:15   Ct Cervical Spine Wo Contrast  07/01/2015   CLINICAL DATA:  80 year old male found on the floor  EXAM: CT HEAD WITHOUT CONTRAST  CT CERVICAL SPINE WITHOUT CONTRAST  TECHNIQUE: Multidetector CT imaging of the head and cervical spine was performed following the standard protocol without  intravenous contrast. Multiplanar CT image reconstructions of the cervical spine were also generated.  COMPARISON:  None.  FINDINGS: CT HEAD FINDINGS  There is slight prominence of the ventricles and sulci compatible with age-related volume loss. Mild periventricular and deep white matter hypodensities represent chronic microvascular ischemic changes. There is no intracranial hemorrhage. No mass effect or midline shift identified.  There is mild mucoperiosteal thickening of paranasal sinuses. The mastoid air cells are well aerated. There is opacification of the left external auditory canal. Correlation with clinical exam recommended. Small right forehead and left occipital scalp screws noted. The calvarium is intact.  CT CERVICAL SPINE FINDINGS  There is no acute fracture or subluxation of the cervical spine.Multilevel degenerative changes. There is loss of disc space height at C7-T1.The odontoid and spinous processes are intact.There is normal anatomic alignment of the C1-C2 lateral masses. The visualized soft tissues appear unremarkable.  Left thyroid partially calcified hypodense nodule. Ultrasound may provide better evaluation. There is diffuse stranding of the subcutaneous soft tissues of the anterior neck.  IMPRESSION: No acute intracranial pathology.  No acute cervical spine fracture.   Electronically Signed   By: Elgie CollardArash  Radparvar M.D.   On: 07/01/2015 22:15   Mr Brain Wo Contrast  07/02/2015   CLINICAL DATA:  Initial evaluation for acute altered mental status, found down, now unresponsive. Delete to have been down for 4-5 days. History of dementia.  EXAM: MRI HEAD WITHOUT CONTRAST  TECHNIQUE: Multiplanar, multiecho pulse sequences of the brain and surrounding structures were obtained without intravenous contrast.  COMPARISON:  Prior CT from 07/01/2015  FINDINGS: Diffuse prominence of the CSF containing spaces is compatible with generalized cerebral atrophy. Patchy and confluent T2/FLAIR hyperintensity  within the periventricular deep white matter both cerebral hemispheres present, most likely related to chronic small vessel ischemic disease, fairly mild for patient age.  No abnormal foci of restricted diffusion to suggest acute intracranial infarct identified. Gray-white matter differentiation maintained. Normal intravascular flow voids are preserved. No acute or chronic intracranial hemorrhage. No areas of chronic infarction identified.  No mass lesion, midline shift, or mass effect. Mild  ventricular prominence related to global parenchymal volume loss present without hydrocephalus. No extra-axial fluid collection.  Craniocervical junction within normal limits. Visualized upper cervical spine demonstrates no acute abnormality. Pituitary gland within normal limits. Midline structures intact.  No acute abnormality about the orbits.  Mild mucosal thickening within the sphenoid sinuses. Small air-fluid levels within the right sphenoid sinus. Paranasal sinuses are otherwise clear. Minimal scattered opacity within the left mastoid air cells without frank mastoid effusion. Inner ear structures within normal limits.  Bone marrow signal intensity within normal limits.  Edema present within the scalp soft tissues posteriorly and at the vertex. Edema present at the right forehead as well. Probable area of skin breakdown present within the visualized left lateral face, compatible with history of pressure ulcer at this location. Skin thinning present at the scalp vertex as well.  IMPRESSION: 1. No acute intracranial infarct or other abnormality identified. 2. Moderate generalized cerebral atrophy with mild chronic small vessel ischemic disease. 3. Soft tissue edema within the scalp posteriorly, at the vertex, and at the right forehead. 4. Focal skin break down within the left lateral face, compatible with history of pressure ulcer at this location.   Electronically Signed   By: Rise Mu M.D.   On: 07/02/2015 07:24    Dg Chest Portable 1 View  07/01/2015   CLINICAL DATA:  80 year old male found on the floor  EXAM: PORTABLE CHEST - 1 VIEW  COMPARISON:  None.  FINDINGS: The heart size and mediastinal contours are within normal limits. Both lungs are clear. The visualized skeletal structures are unremarkable.  IMPRESSION: No active disease.   Electronically Signed   By: Elgie Collard M.D.   On: 07/01/2015 21:16   Dg Humerus Left  07/01/2015   CLINICAL DATA:  80 year old male status post fall with abrasion and bruised the proximal humerus  EXAM: LEFT HUMERUS - 2+ VIEW  COMPARISON:  None.  FINDINGS: There is no evidence of fracture or other focal bone lesions. Soft tissues are unremarkable.  IMPRESSION: No fracture of the left humerus.   Electronically Signed   By: Elgie Collard M.D.   On: 07/01/2015 23:39    CBC  Recent Labs Lab 07/01/15 2103 07/01/15 2107 07/02/15 0459 07/03/15 0554  WBC 16.5*  --  15.0* 13.6*  HGB 16.0 16.7 13.6 12.9*  HCT 50.9 49.0 42.7 40.8  PLT 187  --  150 135*  MCV 95.9  --  95.5 96.7  MCH 30.1  --  30.4 30.6  MCHC 31.4  --  31.9 31.6  RDW 13.9  --  13.9 13.9  LYMPHSABS 1.3  --  1.4 2.2  MONOABS 1.2*  --  1.0 0.8  EOSABS 0.0  --  0.0 0.1  BASOSABS 0.0  --  0.0 0.0    Chemistries   Recent Labs Lab 07/01/15 2103 07/01/15 2107 07/02/15 0459 07/03/15 0554  NA 160* 159* 154* 148*  K 3.9 3.7 3.7 3.7  CL 123* 122* 119* 113*  CO2 28  --  29 28  GLUCOSE 142* 139* 240* 181*  BUN 56* 54* 58* 44*  CREATININE 1.53* 1.70* 1.32* 1.11  CALCIUM 10.5*  --  9.4 9.2  AST 54*  --  57* 52*  ALT 47  --  48 64*  ALKPHOS 52  --  45 48  BILITOT 1.7*  --  1.5* 1.0   ------------------------------------------------------------------------------------------------------------------ estimated creatinine clearance is 49.7 mL/min (by C-G formula based on Cr of  1.11). ------------------------------------------------------------------------------------------------------------------ No results for input(s):  HGBA1C in the last 72 hours. ------------------------------------------------------------------------------------------------------------------ No results for input(s): CHOL, HDL, LDLCALC, TRIG, CHOLHDL, LDLDIRECT in the last 72 hours. ------------------------------------------------------------------------------------------------------------------ No results for input(s): TSH, T4TOTAL, T3FREE, THYROIDAB in the last 72 hours.  Invalid input(s): FREET3 ------------------------------------------------------------------------------------------------------------------ No results for input(s): VITAMINB12, FOLATE, FERRITIN, TIBC, IRON, RETICCTPCT in the last 72 hours.  Coagulation profile  Recent Labs Lab 07/01/15 2103  INR 1.26    No results for input(s): DDIMER in the last 72 hours.  Cardiac Enzymes  Recent Labs Lab 07/02/15 0459 07/02/15 1730  TROPONINI 0.64* 0.54*   ------------------------------------------------------------------------------------------------------------------ Invalid input(s): POCBNP    Najir Roop D.O. on 07/03/2015 at 12:57 PM  Between 7am to 7pm - Pager - [phone]  After 7pm go to [email or web address] - password [email or web address]  And look for the night coverage person covering for me after hours  [facility] Office  [phone]

## 2015-07-04 ENCOUNTER — Encounter (HOSPITAL_COMMUNITY): Payer: Self-pay | Admitting: Cardiovascular Disease

## 2015-07-04 DIAGNOSIS — R4182 Altered mental status, unspecified: Secondary | ICD-10-CM

## 2015-07-04 DIAGNOSIS — R7989 Other specified abnormal findings of blood chemistry: Secondary | ICD-10-CM

## 2015-07-04 LAB — COMPREHENSIVE METABOLIC PANEL
ALT: 48 U/L (ref 17–63)
ANION GAP: 6 (ref 5–15)
AST: 36 U/L (ref 15–41)
Albumin: 2.3 g/dL — ABNORMAL LOW (ref 3.5–5.0)
Alkaline Phosphatase: 51 U/L (ref 38–126)
BUN: 32 mg/dL — AB (ref 6–20)
CALCIUM: 8.5 mg/dL — AB (ref 8.9–10.3)
CHLORIDE: 107 mmol/L (ref 101–111)
CO2: 27 mmol/L (ref 22–32)
Creatinine, Ser: 0.84 mg/dL (ref 0.61–1.24)
GFR calc Af Amer: >60 mL/min (ref >60)
GFR calc non Af Amer: >60 mL/min (ref >60)
Glucose, Bld: 136 mg/dL — ABNORMAL HIGH (ref 65–99)
Potassium: 4 mmol/L (ref 3.5–5.1)
Sodium: 140 mmol/L (ref 135–145)
Total Bilirubin: 0.9 mg/dL (ref 0.3–1.2)
Total Protein: 4.9 g/dL — ABNORMAL LOW (ref 6.5–8.1)

## 2015-07-04 LAB — CBC WITH DIFFERENTIAL/PLATELET
Basophils Absolute: 0 10*3/uL (ref 0.0–0.1)
Basophils Relative: 0 % (ref 0–1)
EOS ABS: 0.3 10*3/uL (ref 0.0–0.7)
EOS PCT: 2 % (ref 0–5)
HCT: 36.6 % — ABNORMAL LOW (ref 39.0–52.0)
HEMOGLOBIN: 12 g/dL — AB (ref 13.0–17.0)
LYMPHS ABS: 2.2 10*3/uL (ref 0.7–4.0)
LYMPHS PCT: 17 % (ref 12–46)
MCH: 30.7 pg (ref 26.0–34.0)
MCHC: 32.8 g/dL (ref 30.0–36.0)
MCV: 93.6 fL (ref 78.0–100.0)
MONO ABS: 1.1 10*3/uL — AB (ref 0.1–1.0)
Monocytes Relative: 8 % (ref 3–12)
NEUTROS ABS: 9.5 10*3/uL — AB (ref 1.7–7.7)
Neutrophils Relative %: 73 % (ref 43–77)
Platelets: 105 10*3/uL — ABNORMAL LOW (ref 150–400)
RBC: 3.91 MIL/uL — AB (ref 4.22–5.81)
RDW: 13.2 % (ref 11.5–15.5)
WBC: 13 10*3/uL — AB (ref 4.0–10.5)

## 2015-07-04 LAB — TROPONIN I: Troponin I: 0.36 ng/mL — ABNORMAL HIGH (ref <0.031)

## 2015-07-04 LAB — TSH: TSH: 0.817 u[IU]/mL (ref 0.350–4.500)

## 2015-07-04 LAB — AMMONIA: Ammonia: 65 umol/L — ABNORMAL HIGH (ref 9–35)

## 2015-07-04 MED ORDER — LACTULOSE ENEMA
300.0000 mL | Freq: Two times a day (BID) | ORAL | Status: DC
  Administered 2015-07-04 – 2015-07-05 (×3): 300 mL via RECTAL
  Filled 2015-07-04 (×3): qty 300

## 2015-07-04 NOTE — Consult Note (Signed)
Patient ID: Corey Knight MRN: [id number] DOB/AGE: 19-Feb-1935 80 y.o.  Admit date: 07/01/2015 Referring Physician: Catha Gosselin Primary Cardiologist: new Reason for Consultation: elevated troponin  HPI: 80 yo male with history of dementia, HLD, HTN, TIA admitted after he was found down at home. He was initially unresponsive with possible down time of 2 days. Now awake. EKG with ectopic atrial rhythm. Chest x-ray clear. Troponin peak 0.64 and now trending down. He has no complaints at this time. He denies chest pain, SOB, palpitations. He has no known cardiac disease.    Past Medical History  Diagnosis Date  . Hyperlipemia   . Dementia   . Alzheimer disease   . Hypertension   . TIA (transient ischemic attack)     Family History  Problem Relation Age of Onset  . Hypertension Mother     History   Social History  . Marital Status: Single    Spouse Name: N/A  . Number of Children: N/A  . Years of Education: N/A   Occupational History  . Not on file.   Social History Main Topics  . Smoking status: Never Smoker   . Smokeless tobacco: Not on file  . Alcohol Use: No  . Drug Use: No  . Sexual Activity: Not on file   Other Topics Concern  . Not on file   Social History Narrative   ** Merged History Encounter **        Past Surgical History  Procedure Laterality Date  . Unknown      No Known Allergies  Hospital Medications:  . aspirin EC  81 mg Oral Daily  . enoxaparin (LOVENOX) injection  40 mg Subcutaneous Q24H  . famotidine (PEPCID) IV  20 mg Intravenous Q12H  . lactulose  300 mL Rectal BID    Review of systems complete and found to be negative unless listed above    Physical Exam: Blood pressure 135/76, pulse 67, temperature 98.1 F (36.7 C), temperature source Oral, resp. rate 18, height  (1.727 m), weight 138 lb 14.2 oz (63 kg), SpO2 99 %.    General: Somnolent, opens eyes to voice commands HEENT: OP clear, mucus membranes moist  SKIN: warm, dry.  No rashes.  Neuro: No focal deficits  Musculoskeletal: pt does not cooperate with exam. Psychiatric: Flat affect Neck: No JVD, no carotid bruits, no thyromegaly, no lymphadenopathy.  Lungs:Clear bilaterally, no wheezes, rhonci, crackles  Cardiovascular: Regular rate and rhythm. No murmurs, gallops or rubs.  Abdomen:Soft. Bowel sounds present. Non-tender.  Extremities: No lower extremity edema. Pulses are 2 + in the bilateral DP/PT.   Labs:   Lab Results  Component Value Date   WBC 13.0* 07/04/2015   HGB 12.0* 07/04/2015   HCT 36.6* 07/04/2015   MCV 93.6 07/04/2015   PLT 105* 07/04/2015     Recent Labs Lab 07/04/15 0618  NA 140  K 4.0  CL 107  CO2 27  BUN 32*  CREATININE 0.84  CALCIUM 8.5*  PROT 4.9*  BILITOT 0.9  ALKPHOS 51  ALT 48  AST 36  GLUCOSE 136*   Lab Results  Component Value Date   CKTOTAL 283 07/01/2015   TROPONINI 0.36* 07/04/2015       Chest x-ray: 07/01/15: No acute disease.  Head MRI 07/02/15: 1. No acute intracranial infarct or other abnormality identified. 2. Moderate generalized cerebral atrophy with mild chronic small vessel ischemic disease. 3. Soft tissue edema within the scalp posteriorly, at the vertex, and at the  right forehead. 4. Focal skin break down within the left lateral face, compatible with history of pressure ulcer at this location.  EKG: ectopic atrial rhythm, diffuse T wave flattening  ASSESSMENT AND PLAN:   1. Elevated troponin: Subtle elevation in pt who was found unresponsive with minimal responsive and baseline dementia, now trending down (peak 0.64). He has no chest pain. EKG shows no ischemic changes. Likely that troponin is elevated due to demand ischemia from hypotension while unresponsive. Given his dementia and mental responsiveness, I would not recommend any further cardiac workup at this time.   Will sign off. Please call with questions.    Signed: Verne Carrowhristopher Garvis Downum, MD 07/04/2015, 11:06 AM

## 2015-07-04 NOTE — Progress Notes (Signed)
[facility]                                                                              Patient Demographics  Corey Knight, is a 80 y.o. male, DOB - [birth year], ZOX:[id number]  Admit date - 07/01/2015   Admitting Physician Bobette Mo, MD  Outpatient Primary MD for the patient is Singh,Jasmine, MD  LOS - 3   Chief Complaint  Patient presents with  . Altered Mental Status  . Fall      HPI on 07/02/2015 by Dr. Ernestene Kiel Corey Knight is a 80 y.o. male with below past medical history who was brought to the emergency department after being found lying down in his bathroom. I'll tentatively for at least 2 days. Patient has a history of dementia, but is able to take care of himself and lives on a trailer which does not have air conditioning. Family members got concerned when they were unable to communicate with him for two days and decided to go to his trailer home. He was unable to speak and has been non-verbal since arrival. The history has been provided by Dr. Lynelle Doctor and the patient's record.   Assessment & Plan   Acute encephalopathy -Likely multifactorial secondary to underlying dementia versus hypernatremia -Blood cultures negative to date -MRI of the head negative for acute or cranial infarcts or other abnormality; moderate generalized cerebral atrophy -Supposedly patient was found down and unresponsive at home possibly for 2 days -Toxicology screen negative -Ammonia level 65, will order lactulose -TSH 0.817  Hypernatremia -Resolved, was 154 admission, currently 140 -Possibly secondary to dehydration  Leukocytosis -UA/CXR  negative for infection -Blood culture showed no growth to date  -Trending downward -Possibly reactive  Elevated troponin -Troponin trending downward, currently 0.36 (peak at 0.6) -Wonder if patient has cardiac event leading to his unresponsiveness? -Cardiology consulted and appreciated, no further cardiac workup at this time.    Dementia -Namenda, Aricept currently held  Acute kidney injury -Likely secondary dehydration -Creatinine 1.7 upon admission, currently 0.84 -Continue to monitor  Hyperglycemia  -No known history of diabetes   Hypertension  -Amlodipine, hydralazine, Hyzaar, metoprolol held -Currently normotensive, will add medications if needed  Hyperlipidemia  -Statin held  Multiple wounds/Abrasions -continue wound care  Code Status: Full  Family Communication: None at bedside. Spoke with daughter via phone on 07/03/2015.  Disposition Plan: Admitted.  Will start lactulose today.   Time Spent in minutes   30 minutes  Procedures  None  Consults   None  DVT Prophylaxis  Lovenox  Lab Results  Component Value Date   PLT 105* 07/04/2015    Medications  Scheduled Meds: . aspirin EC  81 mg Oral Daily  . enoxaparin (LOVENOX) injection  40 mg Subcutaneous Q24H  . famotidine (PEPCID) IV  20 mg Intravenous Q12H  . lactulose  300 mL Rectal BID   Continuous Infusions: . dextrose 150 mL/hr at 07/04/15 0205   PRN Meds:.  Antibiotics   Anti-infectives    None      Subjective:   Corey Knight seen and examined today.  Patient responds minimally to questions, does not follow commands. Does open his eyes to sound.  Has history of dementia.    Objective:   Filed Vitals:   07/03/15 0456 07/03/15 1557 07/03/15 2122 07/04/15 0459  BP: 97/65 148/83 154/95 135/76  Pulse: 63 61 78 67  Temp: 98.3 F (36.8 C) 98.3 F (36.8 C) 98.2 F (36.8 C) 98.1 F (36.7 C)  TempSrc: Axillary Oral Oral Oral  Resp: 20 18 18 18   Height:      Weight:      SpO2: 99% 100% 100% 99%    Wt Readings from Last 3 Encounters:  07/02/15 63 kg (138 lb 14.2 oz)  05/16/15 70.761 kg (156 lb)  05/11/15 74.844 kg (165 lb)     Intake/Output Summary (Last 24 hours) at 07/04/15 1135 Last data filed at 07/04/15 0700  Gross per 24 hour  Intake   5550 ml  Output      0 ml  Net   5550 ml     Exam  General: Well developed, no disterss  HEENT: [place], abrasion noted on left side of the face,  mucous membranes moist.  minimal discharge from eyes. No scleral icterus noted   Cardiovascular: S1 S2 auscultated, RRR, no murmurs  Respiratory: Clear to auscultation bilaterally with equal chest rise  Abdomen: Soft, nontender, nondistended, + bowel sounds  Extremities: warm dry without cyanosis clubbing or edema  Neuro: Awake and alert to self only name. Does not follow commands.  Skin: Several areas of skin abrasions on the left side of the body including black eschar with decubitus wounds   Data Review   Micro Results Recent Results (from the past 240 hour(s))  Blood culture (routine x 2)     Status: None (Preliminary result)   Collection Time: 07/01/15 10:37 PM  Result Value Ref Range Status   Specimen Description BLOOD RIGHT HAND  Final   Special Requests BOTTLES DRAWN AEROBIC AND ANAEROBIC 3CC EA  Final   Culture   Final    NO GROWTH 2 DAYS Performed at [facility]    Report Status PENDING  Incomplete  Blood culture (routine x 2)     Status: None (Preliminary result)   Collection Time: 07/01/15 10:40 PM  Result Value Ref Range Status   Specimen Description BLOOD LEFT ANTECUBITAL  Final   Special Requests BOTTLES DRAWN AEROBIC AND ANAEROBIC 5CC EA  Final   Culture   Final    NO GROWTH 2 DAYS Performed at [facility]    Report Status PENDING  Incomplete  Urine culture     Status: None   Collection Time: 07/01/15 11:37 PM  Result Value Ref Range Status   Specimen Description URINE, CATHETERIZED  Final   Special Requests NONE  Final   Culture   Final    NO GROWTH 1 DAY Performed at [facility]    Report Status 07/03/2015 FINAL  Final  MRSA PCR Screening     Status: None   Collection Time: 07/02/15  9:08 AM  Result Value Ref Range Status   MRSA by PCR NEGATIVE NEGATIVE Final    Comment:        The GeneXpert MRSA Assay (FDA approved  for NASAL specimens only), is one component of a comprehensive MRSA colonization surveillance program. It is not intended to diagnose MRSA infection nor to guide or monitor treatment for MRSA infections.     Radiology Reports Dg Pelvis 1-2 Views  07/01/2015   CLINICAL DATA:  80 year old male found on the floor.  Set  EXAM: PELVIS - 1-2 VIEW  COMPARISON:  None.  FINDINGS: There is no evidence of pelvic fracture or diastasis. No pelvic bone lesions are seen.  IMPRESSION: No acute findings.   Electronically Signed   By: Elgie CollardArash  Radparvar M.D.   On: 07/01/2015 21:15   Ct Head Wo Contrast  07/01/2015   CLINICAL DATA:  80 year old male found on the floor  EXAM: CT HEAD WITHOUT CONTRAST  CT CERVICAL SPINE WITHOUT CONTRAST  TECHNIQUE: Multidetector CT imaging of the head and cervical spine was performed following the standard protocol without intravenous contrast. Multiplanar CT image reconstructions of the cervical spine were also generated.  COMPARISON:  None.  FINDINGS: CT HEAD FINDINGS  There is slight prominence of the ventricles and sulci compatible with age-related volume loss. Mild periventricular and deep white matter hypodensities represent chronic microvascular ischemic changes. There is no intracranial hemorrhage. No mass effect or midline shift identified.  There is mild mucoperiosteal thickening of paranasal sinuses. The mastoid air cells are well aerated. There is opacification of the left external auditory canal. Correlation with clinical exam recommended. Small right forehead and left occipital scalp screws noted. The calvarium is intact.  CT CERVICAL SPINE FINDINGS  There is no acute fracture or subluxation of the cervical spine.Multilevel degenerative changes. There is loss of disc space height at C7-T1.The odontoid and spinous processes are intact.There is normal anatomic alignment of the C1-C2 lateral masses. The visualized soft tissues appear unremarkable.  Left thyroid partially  calcified hypodense nodule. Ultrasound may provide better evaluation. There is diffuse stranding of the subcutaneous soft tissues of the anterior neck.  IMPRESSION: No acute intracranial pathology.  No acute cervical spine fracture.   Electronically Signed   By: Elgie CollardArash  Radparvar M.D.   On: 07/01/2015 22:15   Ct Cervical Spine Wo Contrast  07/01/2015   CLINICAL DATA:  80 year old male found on the floor  EXAM: CT HEAD WITHOUT CONTRAST  CT CERVICAL SPINE WITHOUT CONTRAST  TECHNIQUE: Multidetector CT imaging of the head and cervical spine was performed following the standard protocol without intravenous contrast. Multiplanar CT image reconstructions of the cervical spine were also generated.  COMPARISON:  None.  FINDINGS: CT HEAD FINDINGS  There is slight prominence of the ventricles and sulci compatible with age-related volume loss. Mild periventricular and deep white matter hypodensities represent chronic microvascular ischemic changes. There is no intracranial hemorrhage. No mass effect or midline shift identified.  There is mild mucoperiosteal thickening of paranasal sinuses. The mastoid air cells are well aerated. There is opacification of the left external auditory canal. Correlation with clinical exam recommended. Small right forehead and left occipital scalp screws noted. The calvarium is intact.  CT CERVICAL SPINE FINDINGS  There is no acute fracture or subluxation of the cervical spine.Multilevel degenerative changes. There is loss of disc space height at C7-T1.The odontoid and spinous processes are intact.There is normal anatomic alignment of the C1-C2 lateral masses. The visualized soft tissues appear unremarkable.  Left thyroid partially calcified hypodense nodule. Ultrasound may provide better evaluation. There is diffuse stranding of the subcutaneous soft tissues of the anterior neck.  IMPRESSION: No acute intracranial pathology.  No acute cervical spine fracture.   Electronically Signed   By: Elgie CollardArash   Radparvar M.D.   On: 07/01/2015 22:15   Mr Brain Wo Contrast  07/02/2015   CLINICAL DATA:  Initial evaluation for acute altered mental status, found down, now unresponsive. Delete to have been down for 4-5 days. History of dementia.  EXAM: MRI HEAD WITHOUT CONTRAST  TECHNIQUE: Multiplanar, multiecho pulse  sequences of the brain and surrounding structures were obtained without intravenous contrast.  COMPARISON:  Prior CT from 07/01/2015  FINDINGS: Diffuse prominence of the CSF containing spaces is compatible with generalized cerebral atrophy. Patchy and confluent T2/FLAIR hyperintensity within the periventricular deep white matter both cerebral hemispheres present, most likely related to chronic small vessel ischemic disease, fairly mild for patient age.  No abnormal foci of restricted diffusion to suggest acute intracranial infarct identified. Gray-white matter differentiation maintained. Normal intravascular flow voids are preserved. No acute or chronic intracranial hemorrhage. No areas of chronic infarction identified.  No mass lesion, midline shift, or mass effect. Mild ventricular prominence related to global parenchymal volume loss present without hydrocephalus. No extra-axial fluid collection.  Craniocervical junction within normal limits. Visualized upper cervical spine demonstrates no acute abnormality. Pituitary gland within normal limits. Midline structures intact.  No acute abnormality about the orbits.  Mild mucosal thickening within the sphenoid sinuses. Small air-fluid levels within the right sphenoid sinus. Paranasal sinuses are otherwise clear. Minimal scattered opacity within the left mastoid air cells without frank mastoid effusion. Inner ear structures within normal limits.  Bone marrow signal intensity within normal limits.  Edema present within the scalp soft tissues posteriorly and at the vertex. Edema present at the right forehead as well. Probable area of skin breakdown present within the  visualized left lateral face, compatible with history of pressure ulcer at this location. Skin thinning present at the scalp vertex as well.  IMPRESSION: 1. No acute intracranial infarct or other abnormality identified. 2. Moderate generalized cerebral atrophy with mild chronic small vessel ischemic disease. 3. Soft tissue edema within the scalp posteriorly, at the vertex, and at the right forehead. 4. Focal skin break down within the left lateral face, compatible with history of pressure ulcer at this location.   Electronically Signed   By: Rise Mu M.D.   On: 07/02/2015 07:24   Dg Chest Portable 1 View  07/01/2015   CLINICAL DATA:  80 year old male found on the floor  EXAM: PORTABLE CHEST - 1 VIEW  COMPARISON:  None.  FINDINGS: The heart size and mediastinal contours are within normal limits. Both lungs are clear. The visualized skeletal structures are unremarkable.  IMPRESSION: No active disease.   Electronically Signed   By: Elgie Collard M.D.   On: 07/01/2015 21:16   Dg Humerus Left  07/01/2015   CLINICAL DATA:  80 year old male status post fall with abrasion and bruised the proximal humerus  EXAM: LEFT HUMERUS - 2+ VIEW  COMPARISON:  None.  FINDINGS: There is no evidence of fracture or other focal bone lesions. Soft tissues are unremarkable.  IMPRESSION: No fracture of the left humerus.   Electronically Signed   By: Elgie Collard M.D.   On: 07/01/2015 23:39    CBC  Recent Labs Lab 07/01/15 2103 07/01/15 2107 07/02/15 0459 07/03/15 0554 07/04/15 0618  WBC 16.5*  --  15.0* 13.6* 13.0*  HGB 16.0 16.7 13.6 12.9* 12.0*  HCT 50.9 49.0 42.7 40.8 36.6*  PLT 187  --  150 135* 105*  MCV 95.9  --  95.5 96.7 93.6  MCH 30.1  --  30.4 30.6 30.7  MCHC 31.4  --  31.9 31.6 32.8  RDW 13.9  --  13.9 13.9 13.2  LYMPHSABS 1.3  --  1.4 2.2 2.2  MONOABS 1.2*  --  1.0 0.8 1.1*  EOSABS 0.0  --  0.0 0.1 0.3  BASOSABS 0.0  --  0.0 0.0 0.0  Chemistries   Recent Labs Lab  07/01/15 2103 07/01/15 2107 07/02/15 0459 07/03/15 0554 07/04/15 0618  NA 160* 159* 154* 148* 140  K 3.9 3.7 3.7 3.7 4.0  CL 123* 122* 119* 113* 107  CO2 28  --  29 28 27   GLUCOSE 142* 139* 240* 181* 136*  BUN 56* 54* 58* 44* 32*  CREATININE 1.53* 1.70* 1.32* 1.11 0.84  CALCIUM 10.5*  --  9.4 9.2 8.5*  AST 54*  --  57* 52* 36  ALT 47  --  48 64* 48  ALKPHOS 52  --  45 48 51  BILITOT 1.7*  --  1.5* 1.0 0.9   ------------------------------------------------------------------------------------------------------------------ estimated creatinine clearance is 65.6 mL/min (by C-G formula based on Cr of 0.84). ------------------------------------------------------------------------------------------------------------------ No results for input(s): HGBA1C in the last 72 hours. ------------------------------------------------------------------------------------------------------------------ No results for input(s): CHOL, HDL, LDLCALC, TRIG, CHOLHDL, LDLDIRECT in the last 72 hours. ------------------------------------------------------------------------------------------------------------------  Recent Labs  07/04/15 0430  TSH 0.817   ------------------------------------------------------------------------------------------------------------------ No results for input(s): VITAMINB12, FOLATE, FERRITIN, TIBC, IRON, RETICCTPCT in the last 72 hours.  Coagulation profile  Recent Labs Lab 07/01/15 2103  INR 1.26    No results for input(s): DDIMER in the last 72 hours.  Cardiac Enzymes  Recent Labs Lab 07/02/15 0459 07/02/15 1730 07/04/15 0618  TROPONINI 0.64* 0.54* 0.36*   ------------------------------------------------------------------------------------------------------------------ Invalid input(s): POCBNP    Meggen Spaziani D.O. on 07/04/2015 at 11:35 AM  Between 7am to 7pm - Pager - [phone]  After 7pm go to [email or web address] - password [email or web address]  And look for the  night coverage person covering for me after hours  [facility] Office  [phone]

## 2015-07-05 ENCOUNTER — Inpatient Hospital Stay (HOSPITAL_COMMUNITY): Payer: Commercial Managed Care - HMO

## 2015-07-05 ENCOUNTER — Inpatient Hospital Stay (HOSPITAL_COMMUNITY)
Admit: 2015-07-05 | Discharge: 2015-07-05 | Disposition: A | Discharge status: Home / Self Care | Payer: Commercial Managed Care - HMO | Attending: Internal Medicine | Admitting: Internal Medicine

## 2015-07-05 DIAGNOSIS — G9340 Encephalopathy, unspecified: Secondary | ICD-10-CM

## 2015-07-05 LAB — CBC WITH DIFFERENTIAL/PLATELET
Basophils Absolute: 0 10*3/uL (ref 0.0–0.1)
Basophils Relative: 0 % (ref 0–1)
EOS PCT: 1 % (ref 0–5)
Eosinophils Absolute: 0.2 10*3/uL (ref 0.0–0.7)
HCT: 35.4 % — ABNORMAL LOW (ref 39.0–52.0)
HEMOGLOBIN: 11.6 g/dL — AB (ref 13.0–17.0)
LYMPHS ABS: 1.3 10*3/uL (ref 0.7–4.0)
Lymphocytes Relative: 9 % — ABNORMAL LOW (ref 12–46)
MCH: 30.4 pg (ref 26.0–34.0)
MCHC: 32.8 g/dL (ref 30.0–36.0)
MCV: 92.7 fL (ref 78.0–100.0)
MONO ABS: 0.8 10*3/uL (ref 0.1–1.0)
MONOS PCT: 6 % (ref 3–12)
NEUTROS ABS: 11.2 10*3/uL — AB (ref 1.7–7.7)
NEUTROS PCT: 84 % — AB (ref 43–77)
Platelets: 141 10*3/uL — ABNORMAL LOW (ref 150–400)
RBC: 3.82 MIL/uL — ABNORMAL LOW (ref 4.22–5.81)
RDW: 13 % (ref 11.5–15.5)
WBC: 13.5 10*3/uL — AB (ref 4.0–10.5)

## 2015-07-05 LAB — COMPREHENSIVE METABOLIC PANEL
ALK PHOS: 55 U/L (ref 38–126)
ALT: 40 U/L (ref 17–63)
AST: 27 U/L (ref 15–41)
Albumin: 2.4 g/dL — ABNORMAL LOW (ref 3.5–5.0)
Anion gap: 4 — ABNORMAL LOW (ref 5–15)
BUN: 20 mg/dL (ref 6–20)
CALCIUM: 8.6 mg/dL — AB (ref 8.9–10.3)
CHLORIDE: 104 mmol/L (ref 101–111)
CO2: 29 mmol/L (ref 22–32)
Creatinine, Ser: 0.8 mg/dL (ref 0.61–1.24)
GFR calc Af Amer: >60 mL/min (ref >60)
GFR calc non Af Amer: >60 mL/min (ref >60)
GLUCOSE: 151 mg/dL — AB (ref 65–99)
POTASSIUM: 3.5 mmol/L (ref 3.5–5.1)
Sodium: 137 mmol/L (ref 135–145)
TOTAL PROTEIN: 5.2 g/dL — AB (ref 6.5–8.1)
Total Bilirubin: 0.7 mg/dL (ref 0.3–1.2)

## 2015-07-05 LAB — AMMONIA: Ammonia: 13 umol/L (ref 9–35)

## 2015-07-05 MED ORDER — CETYLPYRIDINIUM CHLORIDE 0.05 % MT LIQD
7.0000 mL | Freq: Two times a day (BID) | OROMUCOSAL | Status: DC
  Administered 2015-07-05 – 2015-07-09 (×6): 7 mL via OROMUCOSAL

## 2015-07-05 MED ORDER — CHLORHEXIDINE GLUCONATE 0.12 % MT SOLN
15.0000 mL | Freq: Two times a day (BID) | OROMUCOSAL | Status: DC
  Administered 2015-07-05 – 2015-07-08 (×5): 15 mL via OROMUCOSAL
  Filled 2015-07-05 (×11): qty 15

## 2015-07-05 MED ORDER — VITAMINS A & D EX OINT
TOPICAL_OINTMENT | CUTANEOUS | Status: AC
  Administered 2015-07-05: 5
  Filled 2015-07-05: qty 5

## 2015-07-05 NOTE — Progress Notes (Signed)
Physical Therapy Treatment Patient Details Name: Corey Knight MRN: [id number] DOB: May 09, 1935 Today's Date: 07/05/2015    History of Present Illness 80 yo male admiited found on floor at home. Admitted with multiple abrasions/wounds and hypernatremia. Hx of Alz, TIA, HTN.     PT Comments    Pt remains lethargic and does not follow commands. Only slightly opened eyes during brief session. Performed PROM bil LEs. Attempted UE ROM, cervical ROM/positioning  but pt resistive. Any attempts at OOB will require lift equipment at this time.   Follow Up Recommendations  SNF;Supervision/Assistance - 24 hour     Equipment Recommendations  None recommended by PT    Recommendations for Other Services OT consult     Precautions / Restrictions Precautions Precautions: Fall Restrictions Weight Bearing Restrictions: No    Mobility  Bed Mobility                  Transfers                    Ambulation/Gait                 Stairs            Wheelchair Mobility    Modified Rankin (Stroke Patients Only)       Balance                                    Cognition Arousal/Alertness: Lethargic   Overall Cognitive Status: No family/caregiver present to determine baseline cognitive functioning                      Exercises General Exercises - Upper Extremity Elbow Flexion: PROM;Left;10 reps (pt would not allow elbow flexion on R side) General Exercises - Lower Extremity Ankle Circles/Pumps: PROM;10 reps;Both Heel Slides: PROM;Both;10 reps Hip ABduction/ADduction: PROM;Both;10 reps    General Comments        Pertinent Vitals/Pain Pain Assessment: Faces Faces Pain Scale: Hurts even more Pain Location: with movement of UEs and attempt to turn head Pain Descriptors / Indicators: Grimacing Pain Intervention(s): Monitored during session    Home Living                      Prior Function            PT Goals  (current goals can now be found in the care plan section) Progress towards PT goals: Not progressing toward goals - comment (remains lethargic; not following commands)    Frequency  Min 2X/week    PT Plan Current plan remains appropriate    Co-evaluation             End of Session   Activity Tolerance: Patient limited by lethargy Patient left: in bed;with call bell/phone within reach;with bed alarm set     Time: 0902-0911 PT Time Calculation (min) (ACUTE ONLY): 9 min  Charges:  $Therapeutic Exercise: 8-22 mins                    G Codes:      Rebeca AlertJannie Patricio Popwell, MPT Pager: [phone]

## 2015-07-05 NOTE — Evaluation (Addendum)
Clinical/Bedside Swallow Evaluation Patient Details  Name: Corey Knight MRN: [id number] Date of Birth: 12-Jun-1935  Today's Date: 07/05/2015 Time: SLP Start Time (ACUTE ONLY): 1200 SLP Stop Time (ACUTE ONLY): 1245 SLP Time Calculation (min) (ACUTE ONLY): 45 min  Past Medical History:  Past Medical History  Diagnosis Date  . Hyperlipemia   . Dementia   . Alzheimer disease   . Hypertension   . TIA (transient ischemic attack)    Past Surgical History:  Past Surgical History  Procedure Laterality Date  . Unknown     HPI:  80 yo male adm to [facility] after being found down = family had been unable to contact pt for 2 days. Pt found to have hypernatremic.  PMH + for TIA and Alzheimer's disease.  MRI negative, cerebral atrophy.  Per RN, pt lived alone prior to admission.    Assessment / Plan / Recommendation Clinical Impression  At this time, pt's mental status and suspected gross weakness contribute to pt's severe dysphagia.   Suspected aspiration of even secretions and small amount of water from toothette used for oral care.   Compromised airway protection characterized by weak/wet voice and baseline congested cough.  Pt did not consistently follow commands and required total cues to seal lips on oral suction SLP set up to help provide oral care.    SLP administered only a single ice chip and small bite of applesauce.  Poor laryngeal elevation, multiple swallows and WEAK throat clearing/cough noted concerning for residuals and aspiration.  Pt did not cough/throat clear strongly despite max verbal/visual/tactile cues.    Recommend pt be NPO except minimal ice chips with full assist to aid oral hygeine.  SLP to follow up next date to assess for po readiness/indication from instrumental swallow evaluation.     Educated daughter to concern for aspiration of secretions, importance of oral care and recommendations - she reported understanding to clinical reasoning for plan.      Aspiration Risk  Severe   Diet Recommendation NPO;Ice chips PRN after oral care (few small single ice chips sitting fully upright )   Medication Administration: Via alternative means Compensations: Hard cough after swallow    Other  Recommendations Oral Care Recommendations:  (Oral care QD) Other Recommendations: Have oral suction available   Follow Up Recommendations       Frequency and Duration min 2x/week  1 week   Pertinent Vitals/Pain Afebrile, congested     Swallow Study Prior Functional Status   see HHX    General Date of Onset: 07/05/15 Other Pertinent Information: 80 yo male adm to [facility] after being found down = family had been unable to contact pt for 2 days. Pt found to have hypernatremic.  PMH + for TIA and Alzheimer's disease.  MRI negative, cerebral atrophy.  Per RN, pt lived alone prior to admission.  Type of Study: Bedside swallow evaluation Diet Prior to this Study: Regular;Thin liquids Temperature Spikes Noted: No Respiratory Status: Supplemental O2 delivered via (comment) History of Recent Intubation: No Behavior/Cognition: Lethargic/Drowsy;Requires cueing;Doesn't follow directions;Distractible;Confused Oral Cavity - Dentition: Adequate natural dentition/normal for age Self-Feeding Abilities: Total assist Patient Positioning: Upright in bed Baseline Vocal Quality: Hoarse;Low vocal intensity;Wet Volitional Cough: Weak;Cognitively unable to elicit (weak reflexive cough) Volitional Swallow: Able to elicit    Oral/Motor/Sensory Function Overall Oral Motor/Sensory Function: Impaired (gross weakness noted, no focal CN deficits from evaluation able to be completed, pt did not consistently follow commands)   Ice Chips Ice chips: Impaired Presentation: Spoon Oral Phase  Impairments: Reduced lingual movement/coordination;Impaired anterior to posterior transit Oral Phase Functional Implications: Prolonged oral transit;Oral holding Pharyngeal Phase Impairments: Suspected delayed  Swallow;Decreased hyoid-laryngeal movement;Wet Vocal Quality;Cough - Immediate   Thin Liquid Thin Liquid: Not tested    Nectar Thick Nectar Thick Liquid: Not tested   Honey Thick Honey Thick Liquid: Not tested   Puree Puree: Impaired Presentation: Spoon Oral Phase Impairments: Reduced labial seal;Reduced lingual movement/coordination;Impaired anterior to posterior transit;Poor awareness of bolus Oral Phase Functional Implications: Prolonged oral transit Pharyngeal Phase Impairments: Suspected delayed Swallow;Decreased hyoid-laryngeal movement;Multiple swallows;Throat Clearing - Immediate;Throat Clearing - Delayed;Cough - Delayed   Solid   GO    Solid: Not tested      Corey Burnetamara Terriyah Westra, MS [facility] SLP [phone]     SLP text paged MD with recommendations for NPO after evaluation at approximately 1300.

## 2015-07-05 NOTE — Procedures (Signed)
ELECTROENCEPHALOGRAM REPORT  Date of Study: 07/05/2015  Patient's Name: Beatris SiJames D Battaglia MRN: [id number] Date of Birth: August 08, 1935  Referring Provider: Dr. Canary BrimBrandi Ollis  Clinical History: This is a 80 year old man found on the floor at home with multiple abrasions and hypernatremia. He has been non-verbal and unable to follow commands.  Medications: aspirin EC tablet 81 mg famotidine (PEPCID) IVPB 20 mg premix lactulose (CHRONULAC) enema 200 gm vitamin A & D ointment  Technical Summary: A multichannel digital EEG recording measured by the international 10-20 system with electrodes applied with paste and impedances below 5000 ohms performed as portable with EKG monitoring in an awake and asleep patient.  Hyperventilation and photic stimulation were not performed.  The digital EEG was referentially recorded, reformatted, and digitally filtered in a variety of bipolar and referential montages for optimal display.   Description: The patient is awake and asleep during the recording.  There is no clear posterior dominant rhythm. The background consists of a large amount of diffuse 4-5 Hz theta and 2-3 Hz delta slowing.  During drowsiness and sleep, there is an increase in theta and delta slowing of the background with poorly formed vertex waves seen. Hyperventilation and photic stimulation were not performed.  There were no epileptiform discharges or electrographic seizures seen.    EKG lead showed sinus bradycardia.  Impression: This awake and asleep EEG is abnormal due to moderate diffuse slowing of the waking background.  Clinical Correlation of the above findings indicates diffuse cerebral dysfunction that is non-specific in etiology and can be seen with hypoxic/ischemic injury, toxic/metabolic encephalopathies, or medication effect.  The absence of epileptiform discharges does not rule out a clinical diagnosis of epilepsy.  Clinical correlation is advised.   Patrcia DollyKaren Atlee Kluth, M.D.

## 2015-07-05 NOTE — Progress Notes (Signed)
EEG completed, results pending. 

## 2015-07-05 NOTE — Progress Notes (Signed)
[facility]                                                                              Patient Demographics  Corey Knight, is a 80 y.o. male, DOB - 02/27/1935, UJW:[id number]  Admit date - 07/01/2015   Admitting Physician Bobette Moavid Corey Ortiz, MD  Outpatient Primary MD for the patient is Knight,Jasmine, MD  LOS - 4   Chief Complaint  Patient presents with  . Altered Mental Status  . Fall      HPI on 07/02/2015 by Dr. Ernestene Kielavid Corey Nani GasserOrtiz Corey Knight is a 80 y.o. male with below past medical history who was brought to the emergency department after being found lying down in his bathroom. I'll tentatively for at least 2 days. Patient has a history of dementia, but is able to take care of himself and lives on a trailer which does not have air conditioning. Family members got concerned when they were unable to communicate with him for two days and decided to go to his trailer home. He was unable to speak and has been non-verbal since arrival. The history has been provided by Dr. Lynelle DoctorKnapp and the patient's record.   Assessment & Plan   Acute encephalopathy -Likely multifactorial secondary to underlying dementia versus hypernatremia -Blood cultures negative to date -MRI of the head negative for acute or cranial infarcts or other abnormality; moderate generalized cerebral atrophy -Supposedly patient was found down and unresponsive at home possibly for 2 days -Toxicology screen negative -Ammonia level 13 (patient was given lactulose) -TSH 0.817 -PT consulted and recommended SNF -Speech recommending NPO  -EEG: awake and asleep EEG is abnormal due to moderate diffuse slowing of the waking background. The above findings indicates diffuse cerebral dysfunction that is non-specific in etiology and can be seen with hypoxic/ischemic injury, toxic/metabolic encephalopathies, or medication effect   Hypernatremia -Resolved, was 154 admission, currently 141 -Possibly secondary to  dehydration  Leukocytosis -UA/CXR  negative for infection -Blood culture showed no growth to date  -Possibly reactive  Elevated troponin -Troponin trending downward, currently 0.36 (peak at 0.6) -Wonder if patient has cardiac event leading to his unresponsiveness? -Cardiology consulted and appreciated, no further cardiac workup at this time.   Dementia -Namenda, Aricept currently held  Acute kidney injury -Likely secondary dehydration -Creatinine 1.7 upon admission, currently 0.84 -Continue to monitor  Hyperglycemia  -No known history of diabetes   Hypertension  -Amlodipine, hydralazine, Hyzaar, metoprolol held -Currently normotensive, will add medications if needed  Hyperlipidemia  -Statin held  Multiple wounds/Abrasions -continue wound care  Code Status: Full  Family Communication: None at bedside.   Disposition Plan: Admitted.  Pending improvement.   Time Spent in minutes   30 minutes  Procedures  None  Consults   None  DVT Prophylaxis  Lovenox  Lab Results  Component Value Date   PLT 141* 07/05/2015    Medications  Scheduled Meds: . antiseptic oral rinse  7 mL Mouth Rinse q12n4p  . aspirin EC  81 mg Oral Daily  . chlorhexidine  15 mL Mouth Rinse BID  . enoxaparin (LOVENOX) injection  40 mg Subcutaneous Q24H  . famotidine (PEPCID) IV  20 mg Intravenous Q12H  .  lactulose  300 mL Rectal BID  . vitamin A & D       Continuous Infusions: . dextrose 150 mL/hr at 07/05/15 1330   PRN Meds:.  Antibiotics   Anti-infectives    None      Subjective:   Corey Macho seen and examined today.  Patient responds minimally to questions, follows commands this morning.  Does open his eyes to sound. Has history of dementia.    Objective:   Filed Vitals:   07/04/15 1345 07/04/15 2126 07/05/15 0559 07/05/15 1421  BP: 166/85 147/89 150/81 143/89  Pulse: 52 58 55 66  Temp: 97.5 F (36.4 C) 98 F (36.7 C) 97.3 F (36.3 C) 97.7 F (36.5 C)  TempSrc:  Axillary Oral Oral Oral  Resp: 18 20 20 18   Height:      Weight:      SpO2: 100% 100% 99% 100%    Wt Readings from Last 3 Encounters:  07/02/15 63 kg (138 lb 14.2 oz)  05/16/15 70.761 kg (156 lb)  05/11/15 74.844 kg (165 lb)     Intake/Output Summary (Last 24 hours) at 07/05/15 1521 Last data filed at 07/05/15 1300  Gross per 24 hour  Intake    600 ml  Output    850 ml  Net   -250 ml    Exam  General: Well developed, no disterss  HEENT: [place], abrasion noted on left side of the face,  mucous membranes moist.  minimal discharge from eyes. No scleral icterus noted   Cardiovascular: S1 S2 auscultated, RRR, no murmurs  Respiratory: Clear to auscultation  Abdomen: Soft, nontender, nondistended, + bowel sounds  Extremities: warm dry without cyanosis clubbing or edema  Neuro: Awake and alert to self only name. Does follow commands, can move extremities.   Skin: Several areas of skin abrasions on the left side of the body including black eschar with decubitus wounds   Data Review   Micro Results Recent Results (from the past 240 hour(s))  Blood culture (routine x 2)     Status: None (Preliminary result)   Collection Time: 07/01/15 10:37 PM  Result Value Ref Range Status   Specimen Description BLOOD RIGHT HAND  Final   Special Requests BOTTLES DRAWN AEROBIC AND ANAEROBIC 3CC EA  Final   Culture   Final    NO GROWTH 4 DAYS Performed at [facility]    Report Status PENDING  Incomplete  Blood culture (routine x 2)     Status: None (Preliminary result)   Collection Time: 07/01/15 10:40 PM  Result Value Ref Range Status   Specimen Description BLOOD LEFT ANTECUBITAL  Final   Special Requests BOTTLES DRAWN AEROBIC AND ANAEROBIC 5CC EA  Final   Culture   Final    NO GROWTH 4 DAYS Performed at [facility]    Report Status PENDING  Incomplete  Urine culture     Status: None   Collection Time: 07/01/15 11:37 PM  Result Value Ref Range Status   Specimen  Description URINE, CATHETERIZED  Final   Special Requests NONE  Final   Culture   Final    NO GROWTH 1 DAY Performed at [facility]    Report Status 07/03/2015 FINAL  Final  MRSA PCR Screening     Status: None   Collection Time: 07/02/15  9:08 AM  Result Value Ref Range Status   MRSA by PCR NEGATIVE NEGATIVE Final    Comment:  The GeneXpert MRSA Assay (FDA approved for NASAL specimens only), is one component of a comprehensive MRSA colonization surveillance program. It is not intended to diagnose MRSA infection nor to guide or monitor treatment for MRSA infections.     Radiology Reports Dg Pelvis 1-2 Views  07/01/2015   CLINICAL DATA:  80 year old male found on the floor.  Set  EXAM: PELVIS - 1-2 VIEW  COMPARISON:  None.  FINDINGS: There is no evidence of pelvic fracture or diastasis. No pelvic bone lesions are seen.  IMPRESSION: No acute findings.   Electronically Signed   By: Elgie Collard M.D.   On: 07/01/2015 21:15   Ct Head Wo Contrast  07/01/2015   CLINICAL DATA:  80 year old male found on the floor  EXAM: CT HEAD WITHOUT CONTRAST  CT CERVICAL SPINE WITHOUT CONTRAST  TECHNIQUE: Multidetector CT imaging of the head and cervical spine was performed following the standard protocol without intravenous contrast. Multiplanar CT image reconstructions of the cervical spine were also generated.  COMPARISON:  None.  FINDINGS: CT HEAD FINDINGS  There is slight prominence of the ventricles and sulci compatible with age-related volume loss. Mild periventricular and deep white matter hypodensities represent chronic microvascular ischemic changes. There is no intracranial hemorrhage. No mass effect or midline shift identified.  There is mild mucoperiosteal thickening of paranasal sinuses. The mastoid air cells are well aerated. There is opacification of the left external auditory canal. Correlation with clinical exam recommended. Small right forehead and left occipital scalp  screws noted. The calvarium is intact.  CT CERVICAL SPINE FINDINGS  There is no acute fracture or subluxation of the cervical spine.Multilevel degenerative changes. There is loss of disc space height at C7-T1.The odontoid and spinous processes are intact.There is normal anatomic alignment of the C1-C2 lateral masses. The visualized soft tissues appear unremarkable.  Left thyroid partially calcified hypodense nodule. Ultrasound may provide better evaluation. There is diffuse stranding of the subcutaneous soft tissues of the anterior neck.  IMPRESSION: No acute intracranial pathology.  No acute cervical spine fracture.   Electronically Signed   By: Elgie Collard M.D.   On: 07/01/2015 22:15   Ct Cervical Spine Wo Contrast  07/01/2015   CLINICAL DATA:  80 year old male found on the floor  EXAM: CT HEAD WITHOUT CONTRAST  CT CERVICAL SPINE WITHOUT CONTRAST  TECHNIQUE: Multidetector CT imaging of the head and cervical spine was performed following the standard protocol without intravenous contrast. Multiplanar CT image reconstructions of the cervical spine were also generated.  COMPARISON:  None.  FINDINGS: CT HEAD FINDINGS  There is slight prominence of the ventricles and sulci compatible with age-related volume loss. Mild periventricular and deep white matter hypodensities represent chronic microvascular ischemic changes. There is no intracranial hemorrhage. No mass effect or midline shift identified.  There is mild mucoperiosteal thickening of paranasal sinuses. The mastoid air cells are well aerated. There is opacification of the left external auditory canal. Correlation with clinical exam recommended. Small right forehead and left occipital scalp screws noted. The calvarium is intact.  CT CERVICAL SPINE FINDINGS  There is no acute fracture or subluxation of the cervical spine.Multilevel degenerative changes. There is loss of disc space height at C7-T1.The odontoid and spinous processes are intact.There is normal  anatomic alignment of the C1-C2 lateral masses. The visualized soft tissues appear unremarkable.  Left thyroid partially calcified hypodense nodule. Ultrasound may provide better evaluation. There is diffuse stranding of the subcutaneous soft tissues of the anterior neck.  IMPRESSION: No acute intracranial  pathology.  No acute cervical spine fracture.   Electronically Signed   By: Elgie Collard M.D.   On: 07/01/2015 22:15   Mr Brain Wo Contrast  07/02/2015   CLINICAL DATA:  Initial evaluation for acute altered mental status, found down, now unresponsive. Delete to have been down for 4-5 days. History of dementia.  EXAM: MRI HEAD WITHOUT CONTRAST  TECHNIQUE: Multiplanar, multiecho pulse sequences of the brain and surrounding structures were obtained without intravenous contrast.  COMPARISON:  Prior CT from 07/01/2015  FINDINGS: Diffuse prominence of the CSF containing spaces is compatible with generalized cerebral atrophy. Patchy and confluent T2/FLAIR hyperintensity within the periventricular deep white matter both cerebral hemispheres present, most likely related to chronic small vessel ischemic disease, fairly mild for patient age.  No abnormal foci of restricted diffusion to suggest acute intracranial infarct identified. Gray-white matter differentiation maintained. Normal intravascular flow voids are preserved. No acute or chronic intracranial hemorrhage. No areas of chronic infarction identified.  No mass lesion, midline shift, or mass effect. Mild ventricular prominence related to global parenchymal volume loss present without hydrocephalus. No extra-axial fluid collection.  Craniocervical junction within normal limits. Visualized upper cervical spine demonstrates no acute abnormality. Pituitary gland within normal limits. Midline structures intact.  No acute abnormality about the orbits.  Mild mucosal thickening within the sphenoid sinuses. Small air-fluid levels within the right sphenoid sinus.  Paranasal sinuses are otherwise clear. Minimal scattered opacity within the left mastoid air cells without frank mastoid effusion. Inner ear structures within normal limits.  Bone marrow signal intensity within normal limits.  Edema present within the scalp soft tissues posteriorly and at the vertex. Edema present at the right forehead as well. Probable area of skin breakdown present within the visualized left lateral face, compatible with history of pressure ulcer at this location. Skin thinning present at the scalp vertex as well.  IMPRESSION: 1. No acute intracranial infarct or other abnormality identified. 2. Moderate generalized cerebral atrophy with mild chronic small vessel ischemic disease. 3. Soft tissue edema within the scalp posteriorly, at the vertex, and at the right forehead. 4. Focal skin break down within the left lateral face, compatible with history of pressure ulcer at this location.   Electronically Signed   By: Rise Mu M.D.   On: 07/02/2015 07:24   Dg Chest Portable 1 View  07/01/2015   CLINICAL DATA:  80 year old male found on the floor  EXAM: PORTABLE CHEST - 1 VIEW  COMPARISON:  None.  FINDINGS: The heart size and mediastinal contours are within normal limits. Both lungs are clear. The visualized skeletal structures are unremarkable.  IMPRESSION: No active disease.   Electronically Signed   By: Elgie Collard M.D.   On: 07/01/2015 21:16   Dg Humerus Left  07/01/2015   CLINICAL DATA:  80 year old male status post fall with abrasion and bruised the proximal humerus  EXAM: LEFT HUMERUS - 2+ VIEW  COMPARISON:  None.  FINDINGS: There is no evidence of fracture or other focal bone lesions. Soft tissues are unremarkable.  IMPRESSION: No fracture of the left humerus.   Electronically Signed   By: Elgie Collard M.D.   On: 07/01/2015 23:39    CBC  Recent Labs Lab 07/01/15 2103 07/01/15 2107 07/02/15 0459 07/03/15 0554 07/04/15 0618 07/05/15 0430  WBC 16.5*  --   15.0* 13.6* 13.0* 13.5*  HGB 16.0 16.7 13.6 12.9* 12.0* 11.6*  HCT 50.9 49.0 42.7 40.8 36.6* 35.4*  PLT 187  --  150 135*  105* 141*  MCV 95.9  --  95.5 96.7 93.6 92.7  MCH 30.1  --  30.4 30.6 30.7 30.4  MCHC 31.4  --  31.9 31.6 32.8 32.8  RDW 13.9  --  13.9 13.9 13.2 13.0  LYMPHSABS 1.3  --  1.4 2.2 2.2 1.3  MONOABS 1.2*  --  1.0 0.8 1.1* 0.8  EOSABS 0.0  --  0.0 0.1 0.3 0.2  BASOSABS 0.0  --  0.0 0.0 0.0 0.0    Chemistries   Recent Labs Lab 07/01/15 2103 07/01/15 2107 07/02/15 0459 07/03/15 0554 07/04/15 0618 07/05/15 0430  NA 160* 159* 154* 148* 140 137  K 3.9 3.7 3.7 3.7 4.0 3.5  CL 123* 122* 119* 113* 107 104  CO2 28  --  29 28 27 29   GLUCOSE 142* 139* 240* 181* 136* 151*  BUN 56* 54* 58* 44* 32* 20  CREATININE 1.53* 1.70* 1.32* 1.11 0.84 0.80  CALCIUM 10.5*  --  9.4 9.2 8.5* 8.6*  AST 54*  --  57* 52* 36 27  ALT 47  --  48 64* 48 40  ALKPHOS 52  --  45 48 51 55  BILITOT 1.7*  --  1.5* 1.0 0.9 0.7   ------------------------------------------------------------------------------------------------------------------ estimated creatinine clearance is 68.9 mL/min (by C-G formula based on Cr of 0.8). ------------------------------------------------------------------------------------------------------------------ No results for input(s): HGBA1C in the last 72 hours. ------------------------------------------------------------------------------------------------------------------ No results for input(s): CHOL, HDL, LDLCALC, TRIG, CHOLHDL, LDLDIRECT in the last 72 hours. ------------------------------------------------------------------------------------------------------------------  Recent Labs  07/04/15 0430  TSH 0.817   ------------------------------------------------------------------------------------------------------------------ No results for input(s): VITAMINB12, FOLATE, FERRITIN, TIBC, IRON, RETICCTPCT in the last 72 hours.  Coagulation profile  Recent  Labs Lab 07/01/15 2103  INR 1.26    No results for input(s): DDIMER in the last 72 hours.  Cardiac Enzymes  Recent Labs Lab 07/02/15 0459 07/02/15 1730 07/04/15 0618  TROPONINI 0.64* 0.54* 0.36*   ------------------------------------------------------------------------------------------------------------------ Invalid input(s): POCBNP    Jung Yurchak D.O. on 07/05/2015 at 3:21 PM  Between 7am to 7pm - Pager - [phone]  After 7pm go to [email or web address] - password [email or web address]  And look for the night coverage person covering for me after hours  [facility] Office  [phone]

## 2015-07-06 DIAGNOSIS — L8990 Pressure ulcer of unspecified site, unspecified stage: Secondary | ICD-10-CM

## 2015-07-06 LAB — CULTURE, BLOOD (ROUTINE X 2)
CULTURE: NO GROWTH
CULTURE: NO GROWTH

## 2015-07-06 LAB — COMPREHENSIVE METABOLIC PANEL
ALBUMIN: 2.5 g/dL — AB (ref 3.5–5.0)
ALK PHOS: 54 U/L (ref 38–126)
ALT: 32 U/L (ref 17–63)
AST: 22 U/L (ref 15–41)
Anion gap: 5 (ref 5–15)
BILIRUBIN TOTAL: 0.6 mg/dL (ref 0.3–1.2)
BUN: 13 mg/dL (ref 6–20)
CHLORIDE: 102 mmol/L (ref 101–111)
CO2: 32 mmol/L (ref 22–32)
Calcium: 8.7 mg/dL — ABNORMAL LOW (ref 8.9–10.3)
Creatinine, Ser: 0.78 mg/dL (ref 0.61–1.24)
GFR calc Af Amer: >60 mL/min (ref >60)
GFR calc non Af Amer: >60 mL/min (ref >60)
Glucose, Bld: 132 mg/dL — ABNORMAL HIGH (ref 65–99)
POTASSIUM: 3.6 mmol/L (ref 3.5–5.1)
Sodium: 139 mmol/L (ref 135–145)
Total Protein: 5.3 g/dL — ABNORMAL LOW (ref 6.5–8.1)

## 2015-07-06 LAB — CBC WITH DIFFERENTIAL/PLATELET
Basophils Absolute: 0 10*3/uL (ref 0.0–0.1)
Basophils Relative: 0 % (ref 0–1)
EOS ABS: 0.2 10*3/uL (ref 0.0–0.7)
Eosinophils Relative: 2 % (ref 0–5)
HCT: 35.2 % — ABNORMAL LOW (ref 39.0–52.0)
Hemoglobin: 11.6 g/dL — ABNORMAL LOW (ref 13.0–17.0)
LYMPHS ABS: 1.7 10*3/uL (ref 0.7–4.0)
Lymphocytes Relative: 17 % (ref 12–46)
MCH: 30.6 pg (ref 26.0–34.0)
MCHC: 33 g/dL (ref 30.0–36.0)
MCV: 92.9 fL (ref 78.0–100.0)
MONO ABS: 1 10*3/uL (ref 0.1–1.0)
MONOS PCT: 10 % (ref 3–12)
Neutro Abs: 7.3 10*3/uL (ref 1.7–7.7)
Neutrophils Relative %: 71 % (ref 43–77)
Platelets: 163 10*3/uL (ref 150–400)
RBC: 3.79 MIL/uL — ABNORMAL LOW (ref 4.22–5.81)
RDW: 12.8 % (ref 11.5–15.5)
WBC: 10.2 10*3/uL (ref 4.0–10.5)

## 2015-07-06 LAB — AMMONIA: Ammonia: 21 umol/L (ref 9–35)

## 2015-07-06 MED ORDER — BACITRACIN ZINC 500 UNIT/GM EX OINT
TOPICAL_OINTMENT | Freq: Two times a day (BID) | CUTANEOUS | Status: DC
  Administered 2015-07-06 – 2015-07-08 (×5): via TOPICAL
  Filled 2015-07-06 (×2): qty 14.17

## 2015-07-06 MED ORDER — METHYLPHENIDATE HCL 5 MG PO TABS
5.0000 mg | ORAL_TABLET | Freq: Once | ORAL | Status: AC
  Administered 2015-07-06: 5 mg via ORAL
  Filled 2015-07-06: qty 1

## 2015-07-06 MED ORDER — BACITRACIN ZINC 500 UNIT/GM EX OINT
TOPICAL_OINTMENT | Freq: Two times a day (BID) | CUTANEOUS | Status: DC
  Filled 2015-07-06: qty 28.35

## 2015-07-06 MED ORDER — HYDRALAZINE HCL 20 MG/ML IJ SOLN
10.0000 mg | Freq: Four times a day (QID) | INTRAMUSCULAR | Status: DC | PRN
  Administered 2015-07-06 – 2015-07-08 (×3): 10 mg via INTRAVENOUS
  Filled 2015-07-06 (×3): qty 1

## 2015-07-06 MED ORDER — COLLAGENASE 250 UNIT/GM EX OINT
TOPICAL_OINTMENT | Freq: Every day | CUTANEOUS | Status: DC
  Administered 2015-07-06 – 2015-07-08 (×3): via TOPICAL
  Filled 2015-07-06: qty 30

## 2015-07-06 NOTE — Progress Notes (Signed)
SLP Cancellation Note  Patient Details Name: Corey Knight MRN: [id number] DOB: 03-27-1935   Cancelled treatment:       Reason Eval/Treat Not Completed: Fatigue/lethargy limiting ability to participate   ADAMS,PAT, M.S., CCC-SLP 07/06/2015, 4:31 PM

## 2015-07-06 NOTE — Progress Notes (Signed)
[facility]                                                                              Patient Demographics  Corey Knight, is a 80 y.o. male, DOB - 15-Apr-1935, BJY:[id number]  Admit date - 07/01/2015   Admitting Physician Bobette Mo, MD  Outpatient Primary MD for the patient is Singh,Jasmine, MD  LOS - 5   Chief Complaint  Patient presents with  . Altered Mental Status  . Fall      HPI on 07/02/2015 by Dr. Ernestene Kiel Corey Knight is a 80 y.o. male with below past medical history who was brought to the emergency department after being found lying down in his bathroom. I'll tentatively for at least 2 days. Patient has a history of dementia, but is able to take care of himself and lives on a trailer which does not have air conditioning. Family members got concerned when they were unable to communicate with him for two days and decided to go to his trailer home. He was unable to speak and has been non-verbal since arrival. The history has been provided by Dr. Lynelle Doctor and the patient's record.   Assessment & Plan   Acute encephalopathy -Likely multifactorial secondary to underlying dementia versus hypernatremia -Blood cultures negative to date -MRI of the head negative for acute or cranial infarcts or other abnormality; moderate generalized cerebral atrophy -Supposedly patient was found down and unresponsive at home possibly for 2 days -Toxicology screen negative -Ammonia level 21  (patient was given lactulose) -TSH 0.817 -PT consulted and recommended SNF -Speech recommending NPO  -EEG: awake and asleep EEG is abnormal due to moderate diffuse slowing of the waking background. The above findings indicates diffuse cerebral dysfunction that is non-specific in etiology and can be seen with hypoxic/ischemic injury, toxic/metabolic encephalopathies, or medication effect  -Spoke with neurology, Dr. Amada Jupiter, who recommended trying Ritalin.  Feels that it may take patient  a while before improving given he has underlying dementia.  Hypernatremia -Resolved, was 154 admission, currently 139 -Possibly secondary to dehydration  Leukocytosis -Resolved, Possibly reactive -UA/CXR  negative for infection -Repeat CXR 07/05/2015: No active disease -Blood culture showed no growth to date   Elevated troponin -Troponin trending downward, currently 0.36 (peak at 0.6) -Wonder if patient has cardiac event leading to his unresponsiveness? -Cardiology consulted and appreciated, no further cardiac workup at this time.   Dementia -Namenda, Aricept currently held  Acute kidney injury -Likely secondary dehydration -Creatinine 1.7 upon admission, currently 0.78 -Continue to monitor  Hyperglycemia  -No known history of diabetes   Hypertension  -Amlodipine, hydralazine, Hyzaar, metoprolol held -Currently normotensive, will add medications if needed  Hyperlipidemia  -Statin held  Multiple wounds/Abrasions/Ulcer -continue wound care  Code Status: Full  Family Communication: None at bedside.   Disposition Plan: Admitted.  Pending improvement.  Will start Ritalin.   Time Spent in minutes   30 minutes  Procedures  EEG  Consults   Cardiology Neurology, via phone  DVT Prophylaxis  Lovenox  Lab Results  Component Value Date   PLT 163 07/06/2015    Medications  Scheduled Meds: . antiseptic oral rinse  7 mL Mouth Rinse  [id number]  . aspirin EC  81 mg Oral Daily  . chlorhexidine  15 mL Mouth Rinse BID  . collagenase   Topical Daily  . enoxaparin (LOVENOX) injection  40 mg Subcutaneous Q24H  . famotidine (PEPCID) IV  20 mg Intravenous Q12H   Continuous Infusions: . dextrose 150 mL/hr at 07/06/15 1154   PRN Meds:.  Antibiotics   Anti-infectives    None      Subjective:   Corey Knight seen and examined today.  Patient somnolent this morning.  Does open eyes occasionally.     Objective:   Filed Vitals:   07/04/15 2126 07/05/15 0559 07/05/15  1421 07/06/15 0522  BP: 147/89 150/81 143/89 167/71  Pulse: 58 55 66 56  Temp: 98 F (36.7 C) 97.3 F (36.3 C) 97.7 F (36.5 C) 97.4 F (36.3 C)  TempSrc: Oral Oral Oral Oral  Resp: 20 20 18 20   Height:      Weight:      SpO2: 100% 99% 100% 100%    Wt Readings from Last 3 Encounters:  07/02/15 63 kg (138 lb 14.2 oz)  05/16/15 70.761 kg (156 lb)  05/11/15 74.844 kg (165 lb)     Intake/Output Summary (Last 24 hours) at 07/06/15 1335 Last data filed at 07/06/15 1200  Gross per 24 hour  Intake   5200 ml  Output   2750 ml  Net   2450 ml    Exam (No change from prior days)  General: Well developed, no disterss  HEENT: [place], abrasion noted on left side of the face,  mucous membranes moist.  minimal discharge from eyes. No scleral icterus noted   Cardiovascular: S1 S2 auscultated, RRR, no murmurs  Respiratory: Clear to auscultation  Abdomen: Soft, nontender, nondistended, + bowel sounds  Extremities: warm dry without cyanosis clubbing or edema  Neuro: No change.  Skin: Several areas of skin abrasions on the left side of the body including black eschar with decubitus wounds   Data Review   Micro Results Recent Results (from the past 240 hour(s))  Blood culture (routine x 2)     Status: None   Collection Time: 07/01/15 10:37 PM  Result Value Ref Range Status   Specimen Description BLOOD RIGHT HAND  Final   Special Requests BOTTLES DRAWN AEROBIC AND ANAEROBIC 3CC EA  Final   Culture   Final    NO GROWTH 5 DAYS Performed at [facility]    Report Status 07/06/2015 FINAL  Final  Blood culture (routine x 2)     Status: None   Collection Time: 07/01/15 10:40 PM  Result Value Ref Range Status   Specimen Description BLOOD LEFT ANTECUBITAL  Final   Special Requests BOTTLES DRAWN AEROBIC AND ANAEROBIC 5CC EA  Final   Culture   Final    NO GROWTH 5 DAYS Performed at [facility]    Report Status 07/06/2015 FINAL  Final  Urine culture     Status: None    Collection Time: 07/01/15 11:37 PM  Result Value Ref Range Status   Specimen Description URINE, CATHETERIZED  Final   Special Requests NONE  Final   Culture   Final    NO GROWTH 1 DAY Performed at [facility]    Report Status 07/03/2015 FINAL  Final  MRSA PCR Screening     Status: None   Collection Time: 07/02/15  9:08 AM  Result Value Ref Range Status   MRSA by PCR NEGATIVE NEGATIVE Final  Comment:        The GeneXpert MRSA Assay (FDA approved for NASAL specimens only), is one component of a comprehensive MRSA colonization surveillance program. It is not intended to diagnose MRSA infection nor to guide or monitor treatment for MRSA infections.     Radiology Reports Dg Chest 1 View  07/05/2015   CLINICAL DATA:  Altered mental status.  EXAM: CHEST  1 VIEW  COMPARISON:  07/01/2015  FINDINGS: Stable mild elevation of the right hemidiaphragm with volume loss at the right lung base. There is no evidence of pulmonary edema, consolidation, pneumothorax, nodule or pleural fluid. The heart size and mediastinal contours are normal.  IMPRESSION: No active disease.  Stable elevation of right hemidiaphragm.   Electronically Signed   By: Irish Lack M.D.   On: 07/05/2015 16:47   Dg Pelvis 1-2 Views  07/01/2015   CLINICAL DATA:  80 year old male found on the floor.  Set  EXAM: PELVIS - 1-2 VIEW  COMPARISON:  None.  FINDINGS: There is no evidence of pelvic fracture or diastasis. No pelvic bone lesions are seen.  IMPRESSION: No acute findings.   Electronically Signed   By: Elgie Collard M.D.   On: 07/01/2015 21:15   Ct Head Wo Contrast  07/01/2015   CLINICAL DATA:  80 year old male found on the floor  EXAM: CT HEAD WITHOUT CONTRAST  CT CERVICAL SPINE WITHOUT CONTRAST  TECHNIQUE: Multidetector CT imaging of the head and cervical spine was performed following the standard protocol without intravenous contrast. Multiplanar CT image reconstructions of the cervical spine were also  generated.  COMPARISON:  None.  FINDINGS: CT HEAD FINDINGS  There is slight prominence of the ventricles and sulci compatible with age-related volume loss. Mild periventricular and deep white matter hypodensities represent chronic microvascular ischemic changes. There is no intracranial hemorrhage. No mass effect or midline shift identified.  There is mild mucoperiosteal thickening of paranasal sinuses. The mastoid air cells are well aerated. There is opacification of the left external auditory canal. Correlation with clinical exam recommended. Small right forehead and left occipital scalp screws noted. The calvarium is intact.  CT CERVICAL SPINE FINDINGS  There is no acute fracture or subluxation of the cervical spine.Multilevel degenerative changes. There is loss of disc space height at C7-T1.The odontoid and spinous processes are intact.There is normal anatomic alignment of the C1-C2 lateral masses. The visualized soft tissues appear unremarkable.  Left thyroid partially calcified hypodense nodule. Ultrasound may provide better evaluation. There is diffuse stranding of the subcutaneous soft tissues of the anterior neck.  IMPRESSION: No acute intracranial pathology.  No acute cervical spine fracture.   Electronically Signed   By: Elgie Collard M.D.   On: 07/01/2015 22:15   Ct Cervical Spine Wo Contrast  07/01/2015   CLINICAL DATA:  80 year old male found on the floor  EXAM: CT HEAD WITHOUT CONTRAST  CT CERVICAL SPINE WITHOUT CONTRAST  TECHNIQUE: Multidetector CT imaging of the head and cervical spine was performed following the standard protocol without intravenous contrast. Multiplanar CT image reconstructions of the cervical spine were also generated.  COMPARISON:  None.  FINDINGS: CT HEAD FINDINGS  There is slight prominence of the ventricles and sulci compatible with age-related volume loss. Mild periventricular and deep white matter hypodensities represent chronic microvascular ischemic changes. There  is no intracranial hemorrhage. No mass effect or midline shift identified.  There is mild mucoperiosteal thickening of paranasal sinuses. The mastoid air cells are well aerated. There is opacification of the left external  auditory canal. Correlation with clinical exam recommended. Small right forehead and left occipital scalp screws noted. The calvarium is intact.  CT CERVICAL SPINE FINDINGS  There is no acute fracture or subluxation of the cervical spine.Multilevel degenerative changes. There is loss of disc space height at C7-T1.The odontoid and spinous processes are intact.There is normal anatomic alignment of the C1-C2 lateral masses. The visualized soft tissues appear unremarkable.  Left thyroid partially calcified hypodense nodule. Ultrasound may provide better evaluation. There is diffuse stranding of the subcutaneous soft tissues of the anterior neck.  IMPRESSION: No acute intracranial pathology.  No acute cervical spine fracture.   Electronically Signed   By: Elgie CollardArash  Radparvar M.D.   On: 07/01/2015 22:15   Mr Brain Wo Contrast  07/02/2015   CLINICAL DATA:  Initial evaluation for acute altered mental status, found down, now unresponsive. Delete to have been down for 4-5 days. History of dementia.  EXAM: MRI HEAD WITHOUT CONTRAST  TECHNIQUE: Multiplanar, multiecho pulse sequences of the brain and surrounding structures were obtained without intravenous contrast.  COMPARISON:  Prior CT from 07/01/2015  FINDINGS: Diffuse prominence of the CSF containing spaces is compatible with generalized cerebral atrophy. Patchy and confluent T2/FLAIR hyperintensity within the periventricular deep white matter both cerebral hemispheres present, most likely related to chronic small vessel ischemic disease, fairly mild for patient age.  No abnormal foci of restricted diffusion to suggest acute intracranial infarct identified. Gray-white matter differentiation maintained. Normal intravascular flow voids are preserved. No  acute or chronic intracranial hemorrhage. No areas of chronic infarction identified.  No mass lesion, midline shift, or mass effect. Mild ventricular prominence related to global parenchymal volume loss present without hydrocephalus. No extra-axial fluid collection.  Craniocervical junction within normal limits. Visualized upper cervical spine demonstrates no acute abnormality. Pituitary gland within normal limits. Midline structures intact.  No acute abnormality about the orbits.  Mild mucosal thickening within the sphenoid sinuses. Small air-fluid levels within the right sphenoid sinus. Paranasal sinuses are otherwise clear. Minimal scattered opacity within the left mastoid air cells without frank mastoid effusion. Inner ear structures within normal limits.  Bone marrow signal intensity within normal limits.  Edema present within the scalp soft tissues posteriorly and at the vertex. Edema present at the right forehead as well. Probable area of skin breakdown present within the visualized left lateral face, compatible with history of pressure ulcer at this location. Skin thinning present at the scalp vertex as well.  IMPRESSION: 1. No acute intracranial infarct or other abnormality identified. 2. Moderate generalized cerebral atrophy with mild chronic small vessel ischemic disease. 3. Soft tissue edema within the scalp posteriorly, at the vertex, and at the right forehead. 4. Focal skin break down within the left lateral face, compatible with history of pressure ulcer at this location.   Electronically Signed   By: Rise MuBenjamin  McClintock M.D.   On: 07/02/2015 07:24   Dg Chest Portable 1 View  07/01/2015   CLINICAL DATA:  80 year old male found on the floor  EXAM: PORTABLE CHEST - 1 VIEW  COMPARISON:  None.  FINDINGS: The heart size and mediastinal contours are within normal limits. Both lungs are clear. The visualized skeletal structures are unremarkable.  IMPRESSION: No active disease.   Electronically Signed    By: Elgie CollardArash  Radparvar M.D.   On: 07/01/2015 21:16   Dg Humerus Left  07/01/2015   CLINICAL DATA:  80 year old male status post fall with abrasion and bruised the proximal humerus  EXAM: LEFT HUMERUS - 2+ VIEW  COMPARISON:  None.  FINDINGS: There is no evidence of fracture or other focal bone lesions. Soft tissues are unremarkable.  IMPRESSION: No fracture of the left humerus.   Electronically Signed   By: Elgie Collard M.D.   On: 07/01/2015 23:39    CBC  Recent Labs Lab 07/02/15 0459 07/03/15 0554 07/04/15 0618 07/05/15 0430 07/06/15 0458  WBC 15.0* 13.6* 13.0* 13.5* 10.2  HGB 13.6 12.9* 12.0* 11.6* 11.6*  HCT 42.7 40.8 36.6* 35.4* 35.2*  PLT 150 135* 105* 141* 163  MCV 95.5 96.7 93.6 92.7 92.9  MCH 30.4 30.6 30.7 30.4 30.6  MCHC 31.9 31.6 32.8 32.8 33.0  RDW 13.9 13.9 13.2 13.0 12.8  LYMPHSABS 1.4 2.2 2.2 1.3 1.7  MONOABS 1.0 0.8 1.1* 0.8 1.0  EOSABS 0.0 0.1 0.3 0.2 0.2  BASOSABS 0.0 0.0 0.0 0.0 0.0    Chemistries   Recent Labs Lab 07/02/15 0459 07/03/15 0554 07/04/15 0618 07/05/15 0430 07/06/15 0458  NA 154* 148* 140 137 139  K 3.7 3.7 4.0 3.5 3.6  CL 119* 113* 107 104 102  CO2 29 28 27 29  32  GLUCOSE 240* 181* 136* 151* 132*  BUN 58* 44* 32* 20 13  CREATININE 1.32* 1.11 0.84 0.80 0.78  CALCIUM 9.4 9.2 8.5* 8.6* 8.7*  AST 57* 52* 36 27 22  ALT 48 64* 48 40 32  ALKPHOS 45 48 51 55 54  BILITOT 1.5* 1.0 0.9 0.7 0.6   ------------------------------------------------------------------------------------------------------------------ estimated creatinine clearance is 68.9 mL/min (by C-G formula based on Cr of 0.78). ------------------------------------------------------------------------------------------------------------------ No results for input(s): HGBA1C in the last 72 hours. ------------------------------------------------------------------------------------------------------------------ No results for input(s): CHOL, HDL, LDLCALC, TRIG, CHOLHDL, LDLDIRECT  in the last 72 hours. ------------------------------------------------------------------------------------------------------------------  Recent Labs  07/04/15 0430  TSH 0.817   ------------------------------------------------------------------------------------------------------------------ No results for input(s): VITAMINB12, FOLATE, FERRITIN, TIBC, IRON, RETICCTPCT in the last 72 hours.  Coagulation profile  Recent Labs Lab 07/01/15 2103  INR 1.26    No results for input(s): DDIMER in the last 72 hours.  Cardiac Enzymes  Recent Labs Lab 07/02/15 0459 07/02/15 1730 07/04/15 0618  TROPONINI 0.64* 0.54* 0.36*   ------------------------------------------------------------------------------------------------------------------ Invalid input(s): POCBNP    Aithana Kushner D.O. on 07/06/2015 at 1:35 PM  Between 7am to 7pm - Pager - [phone]  After 7pm go to [email or web address] - password [email or web address]  And look for the night coverage person covering for me after hours  [facility] Office  [phone]

## 2015-07-06 NOTE — Consult Note (Signed)
WOC wound consult note Reason for Consult:Multiple full thickness wounds and pressure injuries sustained during recent fall. Patient pending transfer to a SNF, was living independently prior to admission. Daughter in room at the time of my assessment. Wound type:pressure, trauma Pressure Ulcer POA: Yes Measurement: Left face (mandible):  3cm x 4cm eschar (Traumatic wound) Right hand, 5th digit: 1cm x 1.5cm with depth undetermined due to the presence of yellow slough). Light yellow exudate. (Traumatic wound) Left elbow: 6cm x 2cm x 0.2 cm partial thickness wound with full thickness center (yellow, soft) measuring 2cm x 1cm (Truamatic wound) Left lateral knee (proximal): 5cm x 6.5cm with depth indeterminate due to necrotic tissue.  Light yellow drainage. (Unstageable pressure injury) Left lateral knee (distal): 3cm x 2cm with depth indeterminate due to necrotic tissue. Light yellow drainage.(Unstageable Pressure Injury) Right medial knee: 3cm x 1.5cm with depth obscured by the presence of necrotic tissue. Light yellow drainage. (Unstageable Pressure Injury) Left proximal hip: 4cm x 2cm x 0.2cm with a 2cm x 1.5cm soft yellow center. Moderate yellow drainage.(Stage 3 Pressure Injury) Left hip: 3cm x 4cm necrotic tissue (eschar) (Unstageable Pressure Injury) Sacrum: 5cm x 3cm area of non-blanchable erythema. Nodrainage. (Stage 1 Pressure injury) Wound bed: As noted above. Drainage (amount, consistency, odor) As noted above. Periwound:Intact, dry Dressing procedure/placement/frequency: Orders are provided for nursing staff that include silicone foam dressings to for those areas that require only moisture retentive environment and protection via padding.  The four areas that require enzymatic debridement are the three LE ulcers (left lateral knee proximal, left lateral knee distal, right medial knee) and the left hip and the left mandible eschar which will be dressed twice daily with bacitracin ointment.  Patient is on a pressure redistribution mattress while here and it is suggested that he be on one at the next facility, either foam or air. I am providing pressure redistribution boots for heel protection (they are intact today) and also a pressure redistribution chair cushion for a chair as they will be getting him up in the next care setting. WOC nursing team will not follow, but will remain available to this patient, the nursing and medical teams  Please re-consult if needed. Thanks, Ladona MowLaurie Hovanes Hymas, MSN, RN, GNP, [place], CWON-AP [phone])

## 2015-07-07 DIAGNOSIS — Z515 Encounter for palliative care: Secondary | ICD-10-CM | POA: Diagnosis present

## 2015-07-07 LAB — CBC WITH DIFFERENTIAL/PLATELET
BASOS ABS: 0 10*3/uL (ref 0.0–0.1)
BASOS PCT: 0 % (ref 0–1)
EOS ABS: 0.2 10*3/uL (ref 0.0–0.7)
Eosinophils Relative: 1 % (ref 0–5)
HCT: 36 % — ABNORMAL LOW (ref 39.0–52.0)
Hemoglobin: 11.9 g/dL — ABNORMAL LOW (ref 13.0–17.0)
Lymphocytes Relative: 17 % (ref 12–46)
Lymphs Abs: 2.1 10*3/uL (ref 0.7–4.0)
MCH: 30.6 pg (ref 26.0–34.0)
MCHC: 33.1 g/dL (ref 30.0–36.0)
MCV: 92.5 fL (ref 78.0–100.0)
Monocytes Absolute: 1.3 10*3/uL — ABNORMAL HIGH (ref 0.1–1.0)
Monocytes Relative: 10 % (ref 3–12)
NEUTROS PCT: 72 % (ref 43–77)
Neutro Abs: 8.8 10*3/uL — ABNORMAL HIGH (ref 1.7–7.7)
Platelets: 217 10*3/uL (ref 150–400)
RBC: 3.89 MIL/uL — AB (ref 4.22–5.81)
RDW: 12.8 % (ref 11.5–15.5)
WBC: 12.4 10*3/uL — ABNORMAL HIGH (ref 4.0–10.5)

## 2015-07-07 MED ORDER — METHYLPHENIDATE HCL 5 MG PO TABS: 5.0000 mg | ORAL_TABLET | Freq: Three times a day (TID) | ORAL | Status: DC

## 2015-07-07 NOTE — Consult Note (Signed)
Consultation Note Date: 07/07/2015   Patient Name: Corey Knight  DOB: 07-11-1935  MRN: [id number]  Age / Sex: 80 y.o., male   PCP: Leotis Shames, MD Referring Physician: Edsel Petrin, DO  Reason for Consultation: Establishing goals of care Life limiting illness: Dementia with acute clinical decompensation. Patient was found down in his trailer.  Palliative Care Assessment and Plan Summary of Established Goals of Care and Medical Treatment Preferences   Corey Knight is a 80 y.o. male with past medical history significant for dementia, who was brought to the emergency department after being found lying down in his bathroom. Patient was found after at least 2 days. Patient has a history of dementia, but is able to take care of himself and lives on a trailer which does not have air conditioning. Family members got concerned when they were unable to communicate with him for two days and decided to go to his trailer home. He was unable to speak and has been non-verbal since arrival.  Hospitalization course: Patient was worked up with an MRI of the brain that was suggestive of a trophy. Patient had hypernatremia at the time of admission that has since resolved.Patient continues to be minimally responsive. Has history of dementia.As per initial discussions with neurology and hospital medicine service, a trial of Ritalin was being attempted. EEG was negative for any epileptic activity, positive for possible toxic metabolic encephalopathy or possible anoxic injury.  Palliative consultation has been requested for goals of care discussions. The patient is resting in bed. He appears with multiple bruises/abrasions. He opens his eyes. He does not respond. He does not verbalize. He does track me in the room. Patient's son and daughter are present at the bedside. They state that the patient lives alone in a trailer. They suspect that the patient has had dementia for a while even prior to his diagnosis of  dementia a few years ago. The patient has been on Aricept and Namenda. Daughter [name] states that the patient probably completed advanced directives such as a living will and healthcare power of attorney documentation. She does not believe it is notarized or made legally available. She does not know what the living will stated.  Discussed patient's current medical conditions. Discussed underlying dementia. Discussed that there could be any number of possible etiologies for this current presentation which could include transient ischemic attack, subclinical seizure, possible concussion from fall, electrolytes/metabolic derangement with hypernatremia, possible heat stroke.  Goals of care and CODE STATUS discussions undertaken. Discussed patient's current medical condition. Discussed very real possibility of the patient never getting back to baseline- his independent living status a few days ago. Status of DO NOT RESUSCITATE/DO NOT INTUBATE discussed in detail. Patient's son and daughter would like to further discuss amongst themselves today. I will follow-up with the daughter [name] on 07-08-15. Family seems clear about not inserting PEG tube. Discussion of comfort feedings following aspiration precautions understanding that there always is a risk of silent aspiration in people with dementia discussed in detail.  Discussed patient's need for going to skilled nursing facility towards the end of this hospitalization. Discussed monitoring the patient's disease trajectory over at the nursing facility to see if there is any plateauing/stabilization in his condition. If ongoing decline, have recommended addition of hospice services at the nursing facility.  Currently no acute symptoms needs. Continue to monitor. Palliative will follow up with daughter in a.m. Contacts/Participants in Discussion: Primary Decision Maker:    Patient was currently not decisional, daughter [name],  son [name] HCPOA: no   Code  Status/Advance Care Planning:  CODE STATUS discussions undertaken. Family seriously considering DO NOT RESUSCITATE/DO NOT INTUBATE. There are no advance care planning documents available for my review.  Symptom Management:   No acute symptoms currently. Continue to monitor for nonverbal signs of distress/discomfort such as wincing grimacing and restless movements.  Palliative Prophylaxis: Yes.  Additional Recommendations (Limitations, Scope, Preferences):  Consider DO NOT RESUSCITATE/DO NOT INTUBATE  Skilled nursing facility attempt  Will possibly need hospice in the near future Psycho-social/Spiritual:   Support System: [name]. Son and daughter make decisions.  Desire for further Chaplaincy support:no  Prognosis: < 6 months  Discharge Planning:  Skilled Nursing Facility   Domains of Care: - Physical: no acute signs of distress currently - Psychological: dementia, unable to verbalize meaningfully - Social: was living in trailer by himself, history of dementia, but was independent with his ADLs and ambulating prior to this hospitalization - Spiritual: no acute issues noted in initial encounter - Cultural: has son and daughter. No spouse - Imminently dying: no but prognosis appears guarded - Ethical/Legal: patient   Values: shifting towards gentle treatments, comfort care Life limiting illness: dementia with acute clinical decompensation recently      Chief Complaint/History of Present Illness: presumed fall, found down, hypernatremia, altered mental status  Primary Diagnoses  Present on Admission:  . Hypernatremia . Hypertension . Change in mental status . Dementia  Palliative Review of Systems: completed I have reviewed the medical record, interviewed the patient and family, and examined the patient. The following aspects are pertinent.  Past Medical History  Diagnosis Date  . Hyperlipemia   . Dementia   . Alzheimer disease   . Hypertension   . TIA  (transient ischemic attack)    History   Social History  . Marital Status: Single    Spouse Name: N/A  . Number of Children: N/A  . Years of Education: N/A   Social History Main Topics  . Smoking status: Never Smoker   . Smokeless tobacco: Not on file  . Alcohol Use: No  . Drug Use: No  . Sexual Activity: Not on file   Other Topics Concern  . None   Social History Narrative   ** Merged History Encounter **       Family History  Problem Relation Age of Onset  . Hypertension Mother    Scheduled Meds: . antiseptic oral rinse  7 mL Mouth Rinse q12n4p  . aspirin EC  81 mg Oral Daily  . bacitracin   Topical BID  . chlorhexidine  15 mL Mouth Rinse BID  . collagenase   Topical Daily  . enoxaparin (LOVENOX) injection  40 mg Subcutaneous Q24H  . famotidine (PEPCID) IV  20 mg Intravenous Q12H  . methylphenidate  5 mg Oral TID WC   Continuous Infusions: . dextrose 150 mL/hr at 07/07/15 0733   PRN Meds:.hydrALAZINE Medications Prior to Admission:  Prior to Admission medications   Medication Sig Start Date End Date Taking? Authorizing Provider  amLODipine (NORVASC) 10 MG tablet Take 10 mg by mouth daily.   Yes Historical Provider, MD  donepezil (ARICEPT) 10 MG tablet Take 10 mg by mouth at bedtime.   Yes Historical Provider, MD  hydrALAZINE (APRESOLINE) 25 MG tablet Take 50 mg by mouth Nightly.    Yes Historical Provider, MD  levofloxacin (LEVAQUIN) 500 MG tablet Take 1 tablet (500 mg total) by mouth daily. 05/16/15   Orson Ape, MD  losartan-hydrochlorothiazide [facility])  100-12.5 MG per tablet Take 1 tablet by mouth daily.    Historical Provider, MD  memantine (NAMENDA) 10 MG tablet Take 10 mg by mouth 2 (two) times daily.   Yes Historical Provider, MD  metoprolol succinate (TOPROL-XL) 50 MG 24 hr tablet Take 25 mg by mouth daily. Take with or immediately following a meal.   Yes Historical Provider, MD  NUCYNTA 50 MG TABS tablet Take 1 tablet (50 mg total) by mouth every 6  (six) hours as needed. 1 TO 2 TABS Q 6 HOURS PRN PAIN 05/16/15   Orson ApeMichael R Wolff, MD  ondansetron (ZOFRAN ODT) 8 MG disintegrating tablet Take 1 tablet (8 mg total) by mouth every 6 (six) hours as needed for nausea or vomiting. 05/16/15   Orson ApeMichael R Wolff, MD  oxyCODONE (ROXICODONE) 5 MG immediate release tablet Take 1 tablet (5 mg total) by mouth every 8 (eight) hours as needed. 05/11/15 05/10/16  Chinita Pesterari B Triplett, FNP  simvastatin (ZOCOR) 10 MG tablet Take 10 mg by mouth daily.   Yes Historical Provider, MD  tamsulosin (FLOMAX) 0.4 MG CAPS capsule Take 1 capsule (0.4 mg total) by mouth daily. 05/16/15   Orson ApeMichael R Wolff, MD   No Known Allergies CBC:    Component Value Date/Time   WBC 12.4* 07/07/2015 0506   WBC 7.4 12/21/2013 0604   HGB 11.9* 07/07/2015 0506   HGB 12.9* 12/21/2013 0604   HCT 36.0* 07/07/2015 0506   HCT 38.2* 12/21/2013 0604   PLT 217 07/07/2015 0506   PLT 213 12/21/2013 0604   MCV 92.5 07/07/2015 0506   MCV 91 12/21/2013 0604   NEUTROABS 8.8* 07/07/2015 0506   NEUTROABS 4.8 12/21/2013 0604   LYMPHSABS 2.1 07/07/2015 0506   LYMPHSABS 1.6 12/21/2013 0604   MONOABS 1.3* 07/07/2015 0506   MONOABS 0.7 12/21/2013 0604   EOSABS 0.2 07/07/2015 0506   EOSABS 0.2 12/21/2013 0604   BASOSABS 0.0 07/07/2015 0506   BASOSABS 0.1 12/21/2013 0604   Comprehensive Metabolic Panel:    Component Value Date/Time   NA 139 07/06/2015 0458   NA 138 12/21/2013 0604   K 3.6 07/06/2015 0458   K 3.8 12/21/2013 0604   CL 102 07/06/2015 0458   CL 105 12/21/2013 0604   CO2 32 07/06/2015 0458   CO2 29 12/21/2013 0604   BUN 13 07/06/2015 0458   BUN 22* 12/21/2013 0604   CREATININE 0.78 07/06/2015 0458   CREATININE 1.05 12/21/2013 0604   GLUCOSE 132* 07/06/2015 0458   GLUCOSE 89 12/21/2013 0604   CALCIUM 8.7* 07/06/2015 0458   CALCIUM 9.0 12/21/2013 0604   AST 22 07/06/2015 0458   AST 22 12/15/2013 0524   ALT 32 07/06/2015 0458   ALT 19 12/15/2013 0524   ALKPHOS 54 07/06/2015 0458    ALKPHOS 41* 12/15/2013 0524   BILITOT 0.6 07/06/2015 0458   PROT 5.3* 07/06/2015 0458   PROT 5.7* 12/15/2013 0524   ALBUMIN 2.5* 07/06/2015 0458   ALBUMIN 2.8* 12/15/2013 0524    Physical Exam: Vital Signs: BP 132/83 mmHg  Pulse 76  Temp(Src) 98.1 F (36.7 C) (Axillary)  Resp 18  Ht 5\' 8"  (1.727 m)  Wt 63 kg (138 lb 14.2 oz)  BMI 21.12 kg/m2  SpO2 100% SpO2: SpO2: 100 % O2 Device: O2 Device: Not Delivered O2 Flow Rate:   Intake/output summary:  Intake/Output Summary (Last 24 hours) at 07/07/15 1454 Last data filed at 07/07/15 1300  Gross per 24 hour  Intake   4020 ml  Output  2451 ml  Net   1569 ml   LBM: Last BM Date: 07/05/15 Baseline Weight: Weight: 63 kg (138 lb 14.2 oz) Most recent weight: Weight: 63 kg (138 lb 14.2 oz)  Exam Findings:   elderly gentleman resting in bed, encephalopathic, mumbles incoherently Blisters on lips, knot on head from presumed fall Clear S1 S2 Abdomen soft Skin with abrasions, decubitus wounds, present on admission.                        Palliative Performance Scale: 10% Additional Data Reviewed: Recent Labs     07/05/15  0430  07/06/15  0458  07/07/15  0506  WBC  13.5*  10.2  12.4*  HGB  11.6*  11.6*  11.9*  PLT  141*  163  217  NA  137  139   --   BUN  20  13   --   CREATININE  0.80  0.78   --      Time In: 1400 Time Out: 1455 Time Total: 55 min Greater than 50%  of this time was spent counseling and coordinating care related to the above assessment and plan.  Signed by: Rosalin Hawking, MD [phone] Rosalin Hawking, MD  07/07/2015, 2:54 PM  Please contact Palliative Medicine Team phone at [phone] for questions and concerns.

## 2015-07-07 NOTE — Progress Notes (Signed)
[facility]                                                                              Patient Demographics  Corey Knight, is a 80 y.o. male, DOB - [birth year], ZOX:[id number]  Admit date - 07/01/2015   Admitting Physician Bobette Mo, MD  Outpatient Primary MD for the patient is Singh,Jasmine, MD  LOS - 6   Chief Complaint  Patient presents with  . Altered Mental Status  . Fall      HPI on 07/02/2015 by Dr. Ernestene Kiel Corey Knight is a 80 y.o. male with below past medical history who was brought to the emergency department after being found lying down in his bathroom. I'll tentatively for at least 2 days. Patient has a history of dementia, but is able to take care of himself and lives on a trailer which does not have air conditioning. Family members got concerned when they were unable to communicate with him for two days and decided to go to his trailer home. He was unable to speak and has been non-verbal since arrival. The history has been provided by Dr. Lynelle Doctor and the patient's record.   Interim history Patient continues to be minimally responsive.  Has history of dementia.  Patient's daughter wanted him tested for lyme's disease as he is an outdoors type of person.  So far, workup has been negative.  Spoke with Dr. Amada Jupiter who felt that it could take time for patient to improve.     Assessment & Plan   Acute encephalopathy -Improving slowly -Likely multifactorial secondary to underlying dementia versus hypernatremia -Blood cultures negative to date -MRI of the head negative for acute or cranial infarcts or other abnormality; moderate generalized cerebral atrophy -Supposedly patient was found down and unresponsive at home possibly for 2 days -Toxicology screen negative -Ammonia level 21  (patient was given lactulose) -TSH 0.817 -PT consulted and recommended SNF -Speech recommending NPO  -EEG: awake and asleep EEG is abnormal due to moderate diffuse  slowing of the waking background. The above findings indicates diffuse cerebral dysfunction that is non-specific in etiology and can be seen with hypoxic/ischemic injury, toxic/metabolic encephalopathies, or medication effect  -Spoke with neurology, Dr. Amada Jupiter, who recommended trying Ritalin.  Feels that it may take patient a while before improving given he has underlying dementia. -Will speak to family regarding possible peg tube and palliative care consult for goals of care  Hypernatremia -Resolved, was 154 admission, currently 139 -Possibly secondary to dehydration  Leukocytosis -Resolved, Possibly reactive -UA/CXR  negative for infection -Repeat CXR 07/05/2015: No active disease -Blood culture showed no growth to date   Elevated troponin -Troponin trending downward, currently 0.36 (peak at 0.6) -Wonder if patient has cardiac event leading to his unresponsiveness? -Cardiology consulted and appreciated, no further cardiac workup at this time.   Dementia -Namenda, Aricept currently held  Acute kidney injury -Likely secondary dehydration -Creatinine 1.7 upon admission, currently 0.78 -Continue to monitor  Hyperglycemia  -No known history of diabetes   Hypertension  -Amlodipine, hydralazine, Hyzaar, metoprolol held -Currently normotensive, will add medications if needed  Hyperlipidemia  -Statin held  Multiple wounds/Abrasions/Ulcer -continue wound care  Code  Status: Full  Family Communication: None at bedside. Family via phone.  Disposition Plan: Admitted.  Pending improvement.  Palliative care consulted.  Time Spent in minutes   30 minutes  Procedures  EEG  Consults   Cardiology Neurology, via phone  DVT Prophylaxis  Lovenox  Lab Results  Component Value Date   PLT 217 07/07/2015    Medications  Scheduled Meds: . antiseptic oral rinse  7 mL Mouth Rinse q12n4p  . aspirin EC  81 mg Oral Daily  . bacitracin   Topical BID  . chlorhexidine  15 mL  Mouth Rinse BID  . collagenase   Topical Daily  . enoxaparin (LOVENOX) injection  40 mg Subcutaneous Q24H  . famotidine (PEPCID) IV  20 mg Intravenous Q12H   Continuous Infusions: . dextrose 150 mL/hr at 07/07/15 0733   PRN Meds:.  Antibiotics   Anti-infectives    None      Subjective:   Corey Knight seen and examined today.  Patient somnolent this morning.  Does open eyes occasionally and responds to tactile stimuli.  Can move extremities and follow some simple commands.   Objective:   Filed Vitals:   07/06/15 1500 07/06/15 2053 07/07/15 0102 07/07/15 0403  BP: 135/65 179/81 141/67 167/71  Pulse: 63 70  74  Temp: 98.2 F (36.8 C) 98.2 F (36.8 C) 98 F (36.7 C) 98.1 F (36.7 C)  TempSrc: Oral Axillary Axillary Axillary  Resp: 18 20  20   Height:      Weight:      SpO2: 100% 100%  100%    Wt Readings from Last 3 Encounters:  07/02/15 63 kg (138 lb 14.2 oz)  05/16/15 70.761 kg (156 lb)  05/11/15 74.844 kg (165 lb)     Intake/Output Summary (Last 24 hours) at 07/07/15 1142 Last data filed at 07/07/15 0700  Gross per 24 hour  Intake   4000 ml  Output   3251 ml  Net    749 ml    Exam (No change from prior days)  General: Well developed, no disterss  HEENT: [place], abrasion noted on left side of the face,  mucous membranes moist.  minimal discharge from eyes. No scleral icterus noted   Cardiovascular: S1 S2 auscultated, RRR, no murmurs  Respiratory: Clear to auscultation  Abdomen: Soft, nontender, nondistended, + bowel sounds  Extremities: warm dry without cyanosis clubbing or edema  Neuro: Awake, opens eyes to sound.  Responds to tactile stimuli.  Follows some simple commands.   Skin: Several areas of skin abrasions on the left side of the body including black eschar with decubitus wounds   Data Review   Micro Results Recent Results (from the past 240 hour(s))  Blood culture (routine x 2)     Status: None   Collection Time: 07/01/15 10:37 PM  Result  Value Ref Range Status   Specimen Description BLOOD RIGHT HAND  Final   Special Requests BOTTLES DRAWN AEROBIC AND ANAEROBIC 3CC EA  Final   Culture   Final    NO GROWTH 5 DAYS Performed at [facility]    Report Status 07/06/2015 FINAL  Final  Blood culture (routine x 2)     Status: None   Collection Time: 07/01/15 10:40 PM  Result Value Ref Range Status   Specimen Description BLOOD LEFT ANTECUBITAL  Final   Special Requests BOTTLES DRAWN AEROBIC AND ANAEROBIC 5CC EA  Final   Culture   Final    NO GROWTH 5 DAYS Performed at  [facility]    Report Status 07/06/2015 FINAL  Final  Urine culture     Status: None   Collection Time: 07/01/15 11:37 PM  Result Value Ref Range Status   Specimen Description URINE, CATHETERIZED  Final   Special Requests NONE  Final   Culture   Final    NO GROWTH 1 DAY Performed at [facility]    Report Status 07/03/2015 FINAL  Final  MRSA PCR Screening     Status: None   Collection Time: 07/02/15  9:08 AM  Result Value Ref Range Status   MRSA by PCR NEGATIVE NEGATIVE Final    Comment:        The GeneXpert MRSA Assay (FDA approved for NASAL specimens only), is one component of a comprehensive MRSA colonization surveillance program. It is not intended to diagnose MRSA infection nor to guide or monitor treatment for MRSA infections.     Radiology Reports Dg Chest 1 View  07/05/2015   CLINICAL DATA:  Altered mental status.  EXAM: CHEST  1 VIEW  COMPARISON:  07/01/2015  FINDINGS: Stable mild elevation of the right hemidiaphragm with volume loss at the right lung base. There is no evidence of pulmonary edema, consolidation, pneumothorax, nodule or pleural fluid. The heart size and mediastinal contours are normal.  IMPRESSION: No active disease.  Stable elevation of right hemidiaphragm.   Electronically Signed   By: Irish Lack M.D.   On: 07/05/2015 16:47   Dg Pelvis 1-2 Views  07/01/2015   CLINICAL DATA:  80 year old male  found on the floor.  Set  EXAM: PELVIS - 1-2 VIEW  COMPARISON:  None.  FINDINGS: There is no evidence of pelvic fracture or diastasis. No pelvic bone lesions are seen.  IMPRESSION: No acute findings.   Electronically Signed   By: Elgie Collard M.D.   On: 07/01/2015 21:15   Ct Head Wo Contrast  07/01/2015   CLINICAL DATA:  80 year old male found on the floor  EXAM: CT HEAD WITHOUT CONTRAST  CT CERVICAL SPINE WITHOUT CONTRAST  TECHNIQUE: Multidetector CT imaging of the head and cervical spine was performed following the standard protocol without intravenous contrast. Multiplanar CT image reconstructions of the cervical spine were also generated.  COMPARISON:  None.  FINDINGS: CT HEAD FINDINGS  There is slight prominence of the ventricles and sulci compatible with age-related volume loss. Mild periventricular and deep white matter hypodensities represent chronic microvascular ischemic changes. There is no intracranial hemorrhage. No mass effect or midline shift identified.  There is mild mucoperiosteal thickening of paranasal sinuses. The mastoid air cells are well aerated. There is opacification of the left external auditory canal. Correlation with clinical exam recommended. Small right forehead and left occipital scalp screws noted. The calvarium is intact.  CT CERVICAL SPINE FINDINGS  There is no acute fracture or subluxation of the cervical spine.Multilevel degenerative changes. There is loss of disc space height at C7-T1.The odontoid and spinous processes are intact.There is normal anatomic alignment of the C1-C2 lateral masses. The visualized soft tissues appear unremarkable.  Left thyroid partially calcified hypodense nodule. Ultrasound may provide better evaluation. There is diffuse stranding of the subcutaneous soft tissues of the anterior neck.  IMPRESSION: No acute intracranial pathology.  No acute cervical spine fracture.   Electronically Signed   By: Elgie Collard M.D.   On: 07/01/2015 22:15    Ct Cervical Spine Wo Contrast  07/01/2015   CLINICAL DATA:  80 year old male found on the floor  EXAM: CT  HEAD WITHOUT CONTRAST  CT CERVICAL SPINE WITHOUT CONTRAST  TECHNIQUE: Multidetector CT imaging of the head and cervical spine was performed following the standard protocol without intravenous contrast. Multiplanar CT image reconstructions of the cervical spine were also generated.  COMPARISON:  None.  FINDINGS: CT HEAD FINDINGS  There is slight prominence of the ventricles and sulci compatible with age-related volume loss. Mild periventricular and deep white matter hypodensities represent chronic microvascular ischemic changes. There is no intracranial hemorrhage. No mass effect or midline shift identified.  There is mild mucoperiosteal thickening of paranasal sinuses. The mastoid air cells are well aerated. There is opacification of the left external auditory canal. Correlation with clinical exam recommended. Small right forehead and left occipital scalp screws noted. The calvarium is intact.  CT CERVICAL SPINE FINDINGS  There is no acute fracture or subluxation of the cervical spine.Multilevel degenerative changes. There is loss of disc space height at C7-T1.The odontoid and spinous processes are intact.There is normal anatomic alignment of the C1-C2 lateral masses. The visualized soft tissues appear unremarkable.  Left thyroid partially calcified hypodense nodule. Ultrasound may provide better evaluation. There is diffuse stranding of the subcutaneous soft tissues of the anterior neck.  IMPRESSION: No acute intracranial pathology.  No acute cervical spine fracture.   Electronically Signed   By: Elgie Collard M.D.   On: 07/01/2015 22:15   Mr Brain Wo Contrast  07/02/2015   CLINICAL DATA:  Initial evaluation for acute altered mental status, found down, now unresponsive. Delete to have been down for 4-5 days. History of dementia.  EXAM: MRI HEAD WITHOUT CONTRAST  TECHNIQUE: Multiplanar, multiecho  pulse sequences of the brain and surrounding structures were obtained without intravenous contrast.  COMPARISON:  Prior CT from 07/01/2015  FINDINGS: Diffuse prominence of the CSF containing spaces is compatible with generalized cerebral atrophy. Patchy and confluent T2/FLAIR hyperintensity within the periventricular deep white matter both cerebral hemispheres present, most likely related to chronic small vessel ischemic disease, fairly mild for patient age.  No abnormal foci of restricted diffusion to suggest acute intracranial infarct identified. Gray-white matter differentiation maintained. Normal intravascular flow voids are preserved. No acute or chronic intracranial hemorrhage. No areas of chronic infarction identified.  No mass lesion, midline shift, or mass effect. Mild ventricular prominence related to global parenchymal volume loss present without hydrocephalus. No extra-axial fluid collection.  Craniocervical junction within normal limits. Visualized upper cervical spine demonstrates no acute abnormality. Pituitary gland within normal limits. Midline structures intact.  No acute abnormality about the orbits.  Mild mucosal thickening within the sphenoid sinuses. Small air-fluid levels within the right sphenoid sinus. Paranasal sinuses are otherwise clear. Minimal scattered opacity within the left mastoid air cells without frank mastoid effusion. Inner ear structures within normal limits.  Bone marrow signal intensity within normal limits.  Edema present within the scalp soft tissues posteriorly and at the vertex. Edema present at the right forehead as well. Probable area of skin breakdown present within the visualized left lateral face, compatible with history of pressure ulcer at this location. Skin thinning present at the scalp vertex as well.  IMPRESSION: 1. No acute intracranial infarct or other abnormality identified. 2. Moderate generalized cerebral atrophy with mild chronic small vessel ischemic  disease. 3. Soft tissue edema within the scalp posteriorly, at the vertex, and at the right forehead. 4. Focal skin break down within the left lateral face, compatible with history of pressure ulcer at this location.   Electronically Signed   By: Rise Mu  M.D.   On: 07/02/2015 07:24   Dg Chest Portable 1 View  07/01/2015   CLINICAL DATA:  80 year old male found on the floor  EXAM: PORTABLE CHEST - 1 VIEW  COMPARISON:  None.  FINDINGS: The heart size and mediastinal contours are within normal limits. Both lungs are clear. The visualized skeletal structures are unremarkable.  IMPRESSION: No active disease.   Electronically Signed   By: Elgie Collard M.D.   On: 07/01/2015 21:16   Dg Humerus Left  07/01/2015   CLINICAL DATA:  80 year old male status post fall with abrasion and bruised the proximal humerus  EXAM: LEFT HUMERUS - 2+ VIEW  COMPARISON:  None.  FINDINGS: There is no evidence of fracture or other focal bone lesions. Soft tissues are unremarkable.  IMPRESSION: No fracture of the left humerus.   Electronically Signed   By: Elgie Collard M.D.   On: 07/01/2015 23:39    CBC  Recent Labs Lab 07/03/15 0554 07/04/15 0618 07/05/15 0430 07/06/15 0458 07/07/15 0506  WBC 13.6* 13.0* 13.5* 10.2 12.4*  HGB 12.9* 12.0* 11.6* 11.6* 11.9*  HCT 40.8 36.6* 35.4* 35.2* 36.0*  PLT 135* 105* 141* 163 217  MCV 96.7 93.6 92.7 92.9 92.5  MCH 30.6 30.7 30.4 30.6 30.6  MCHC 31.6 32.8 32.8 33.0 33.1  RDW 13.9 13.2 13.0 12.8 12.8  LYMPHSABS 2.2 2.2 1.3 1.7 2.1  MONOABS 0.8 1.1* 0.8 1.0 1.3*  EOSABS 0.1 0.3 0.2 0.2 0.2  BASOSABS 0.0 0.0 0.0 0.0 0.0    Chemistries   Recent Labs Lab 07/02/15 0459 07/03/15 0554 07/04/15 0618 07/05/15 0430 07/06/15 0458  NA 154* 148* 140 137 139  K 3.7 3.7 4.0 3.5 3.6  CL 119* 113* 107 104 102  CO2 29 28 27 29  32  GLUCOSE 240* 181* 136* 151* 132*  BUN 58* 44* 32* 20 13  CREATININE 1.32* 1.11 0.84 0.80 0.78  CALCIUM 9.4 9.2 8.5* 8.6* 8.7*  AST  57* 52* 36 27 22  ALT 48 64* 48 40 32  ALKPHOS 45 48 51 55 54  BILITOT 1.5* 1.0 0.9 0.7 0.6   ------------------------------------------------------------------------------------------------------------------ estimated creatinine clearance is 68.9 mL/min (by C-G formula based on Cr of 0.78). ------------------------------------------------------------------------------------------------------------------ No results for input(s): HGBA1C in the last 72 hours. ------------------------------------------------------------------------------------------------------------------ No results for input(s): CHOL, HDL, LDLCALC, TRIG, CHOLHDL, LDLDIRECT in the last 72 hours. ------------------------------------------------------------------------------------------------------------------ No results for input(s): TSH, T4TOTAL, T3FREE, THYROIDAB in the last 72 hours.  Invalid input(s): FREET3 ------------------------------------------------------------------------------------------------------------------ No results for input(s): VITAMINB12, FOLATE, FERRITIN, TIBC, IRON, RETICCTPCT in the last 72 hours.  Coagulation profile  Recent Labs Lab 07/01/15 2103  INR 1.26    No results for input(s): DDIMER in the last 72 hours.  Cardiac Enzymes  Recent Labs Lab 07/02/15 0459 07/02/15 1730 07/04/15 0618  TROPONINI 0.64* 0.54* 0.36*   ------------------------------------------------------------------------------------------------------------------ Invalid input(s): POCBNP    Karmen Altamirano D.O. on 07/07/2015 at 11:42 AM  Between 7am to 7pm - Pager - [phone]  After 7pm go to [email or web address] - password [email or web address]  And look for the night coverage person covering for me after hours  [facility] Office  [phone]

## 2015-07-08 DIAGNOSIS — G9340 Encephalopathy, unspecified: Secondary | ICD-10-CM | POA: Diagnosis present

## 2015-07-08 DIAGNOSIS — E870 Hyperosmolality and hypernatremia: Secondary | ICD-10-CM

## 2015-07-08 DIAGNOSIS — I10 Essential (primary) hypertension: Secondary | ICD-10-CM

## 2015-07-08 DIAGNOSIS — N289 Disorder of kidney and ureter, unspecified: Secondary | ICD-10-CM | POA: Diagnosis present

## 2015-07-08 LAB — CBC WITH DIFFERENTIAL/PLATELET
Basophils Absolute: 0 10*3/uL (ref 0.0–0.1)
Basophils Relative: 0 % (ref 0–1)
EOS PCT: 1 % (ref 0–5)
Eosinophils Absolute: 0.1 10*3/uL (ref 0.0–0.7)
HCT: 32.5 % — ABNORMAL LOW (ref 39.0–52.0)
HEMOGLOBIN: 10.7 g/dL — AB (ref 13.0–17.0)
Lymphocytes Relative: 17 % (ref 12–46)
Lymphs Abs: 1.7 10*3/uL (ref 0.7–4.0)
MCH: 30.5 pg (ref 26.0–34.0)
MCHC: 32.9 g/dL (ref 30.0–36.0)
MCV: 92.6 fL (ref 78.0–100.0)
MONO ABS: 1.1 10*3/uL — AB (ref 0.1–1.0)
MONOS PCT: 11 % (ref 3–12)
NEUTROS PCT: 71 % (ref 43–77)
Neutro Abs: 7.1 10*3/uL (ref 1.7–7.7)
Platelets: 231 10*3/uL (ref 150–400)
RBC: 3.51 MIL/uL — AB (ref 4.22–5.81)
RDW: 13.3 % (ref 11.5–15.5)
WBC: 9.9 10*3/uL (ref 4.0–10.5)

## 2015-07-08 LAB — BASIC METABOLIC PANEL
ANION GAP: 3 — AB (ref 5–15)
BUN: 11 mg/dL (ref 6–20)
CO2: 28 mmol/L (ref 22–32)
CREATININE: 0.8 mg/dL (ref 0.61–1.24)
Calcium: 8.4 mg/dL — ABNORMAL LOW (ref 8.9–10.3)
Chloride: 106 mmol/L (ref 101–111)
GFR calc Af Amer: >60 mL/min (ref >60)
GFR calc non Af Amer: >60 mL/min (ref >60)
Glucose, Bld: 102 mg/dL — ABNORMAL HIGH (ref 65–99)
Potassium: 3.2 mmol/L — ABNORMAL LOW (ref 3.5–5.1)
SODIUM: 137 mmol/L (ref 135–145)

## 2015-07-08 LAB — AMMONIA: Ammonia: 9 umol/L (ref 9–35)

## 2015-07-08 MED ORDER — POTASSIUM CHLORIDE CRYS ER 20 MEQ PO TBCR: 40.0000 meq | EXTENDED_RELEASE_TABLET | Freq: Once | ORAL | Status: DC

## 2015-07-08 MED ORDER — HYDRALAZINE HCL 20 MG/ML IJ SOLN: 10.0000 mg | Freq: Four times a day (QID) | INTRAMUSCULAR | Status: DC | PRN

## 2015-07-08 MED ORDER — BACITRACIN 500 UNIT/GM EX OINT
1.0000 "application " | TOPICAL_OINTMENT | Freq: Two times a day (BID) | CUTANEOUS | Status: DC
  Administered 2015-07-09: 1 via TOPICAL
  Filled 2015-07-08 (×3): qty 0.9

## 2015-07-08 MED ORDER — BISACODYL 10 MG RE SUPP: 10.0000 mg | Freq: Every day | RECTAL | Status: DC | PRN

## 2015-07-08 MED ORDER — HYDRALAZINE HCL 20 MG/ML IJ SOLN
5.0000 mg | Freq: Three times a day (TID) | INTRAMUSCULAR | Status: DC
Start: 2015-07-08 — End: 2015-07-09
  Administered 2015-07-08 – 2015-07-09 (×3): 5 mg via INTRAVENOUS
  Filled 2015-07-08: qty 1
  Filled 2015-07-08 (×4): qty 0.25
  Filled 2015-07-08: qty 1

## 2015-07-08 MED ORDER — POTASSIUM CHLORIDE 10 MEQ/100ML IV SOLN
10.0000 meq | INTRAVENOUS | Status: AC
  Administered 2015-07-08 (×3): 10 meq via INTRAVENOUS
  Filled 2015-07-08 (×3): qty 100

## 2015-07-08 NOTE — Progress Notes (Signed)
Daily Progress Note   Patient Name: Corey Knight       Date: 07/08/2015 DOB: 1935/04/23  Age: 80 y.o. MRN#: [id number] Attending Physician: Alison MurrayAlma M Devine, MD Primary Care Physician: Leotis ShamesSingh,Jasmine, MD Admit Date: 07/01/2015  Reason for Consultation/Follow-up: Establishing goals of care Life limiting illness: history of dementia, admitted with presumed fall, ? Syncope, hypernatremia.   Subjective:  resting in bed, bruise on face, lips. Does not open eyes but does reply back "good morning" Interval Events: Overnight events noted, patient at times reported to be more interactive and re directable.   PLAN: 1. Will follow up with daughter today, regarding our discussions on 07-07-15 about DNR, PEG tube etc.  2. D/c regular diet, patient with regular tray for breakfast in his room, remains encephalopathic, high risk for aspiration. Daughter has been feeding him few spoon fulls of ice cream for the past few days.  3. Consider repeat speech swallow evaluation if mentation improves.  4. Recommend SNF rehab if patient's mental status improves gradually from this insult, if not, then consider more comfort measures, addition of hospice etc.  5. Borderline low K, may consider IV replacement since patient is encephalopathic and probably not safe with POs.   Length of Stay: 7 days  Current Medications: Scheduled Meds:  . antiseptic oral rinse  7 mL Mouth Rinse q12n4p  . aspirin EC  81 mg Oral Daily  . bacitracin   Topical BID  . chlorhexidine  15 mL Mouth Rinse BID  . collagenase   Topical Daily  . enoxaparin (LOVENOX) injection  40 mg Subcutaneous Q24H  . famotidine (PEPCID) IV  20 mg Intravenous Q12H  . methylphenidate  5 mg Oral TID WC    Continuous Infusions: . dextrose 150 mL/hr at 07/07/15 1515    PRN Meds: hydrALAZINE  Palliative Performance Scale: 20%     Vital Signs: BP 176/62 mmHg  Pulse 62  Temp(Src) 98.6 F (37 C) (Axillary)  Resp 19  Ht 5\' 8"  (1.727 m)  Wt 63 kg  (138 lb 14.2 oz)  BMI 21.12 kg/m2  SpO2 99% SpO2: SpO2: 99 % O2 Device: O2 Device: Not Delivered O2 Flow Rate:    Intake/output summary:  Intake/Output Summary (Last 24 hours) at 07/08/15 0820 Last data filed at 07/08/15 0500  Gross per 24 hour  Intake   3470 ml  Output   2200 ml  Net   1270 ml   LBM:   Baseline Weight: Weight: 63 kg (138 lb 14.2 oz) Most recent weight: Weight: 63 kg (138 lb 14.2 oz)  Physical Exam: Appears weak, bruises face Shallow pattern of breathing Trace edema Not entirely awake, does respond some, does not open eyes.         Additional Data Reviewed: Recent Labs     07/06/15  0458  07/07/15  0506  07/08/15  0527  WBC  10.2  12.4*  9.9  HGB  11.6*  11.9*  10.7*  PLT  163  217  231  NA  139   --   137  BUN  13   --   11  CREATININE  0.78   --   0.80     Problem List:  Patient Active Problem List   Diagnosis Date Noted  . Encounter for palliative care   . Pressure ulcer 07/06/2015  . Hypertension 07/02/2015  . Change in mental status 07/02/2015  . Dementia 07/02/2015  . Hypernatremia 07/01/2015     Palliative Care Assessment &  Plan    Code Status:  Full code  Goals of Care:   to be determined, based on patient's disease trajectory.   Desire for further Chaplaincy support:no  3. Symptom Management:   no acute symptom needs currently  4. Palliative Prophylaxis:  Stool Softener: Add Dulcolax rectal PRN  5. Prognosis: probably few weeks to few months. If encephalopathy and lack of PO intake continues, then possibly few weeks. if some degree of improvement, then probably few months.   5. Discharge Planning: ?SNF, to be determined.    Care plan was discussed with  Bedside RN. There is no family present in the room, will contact daughter Elon Jester at [phone] today.   Thank you for allowing the Palliative Medicine Team to assist in the care of this patient.   Time In: 0750 Time Out: 0820 Total Time 30 Prolonged Time Billed   no     Greater than 50%  of this time was spent counseling and coordinating care related to the above assessment and plan.  [phone] Rosalin Hawking, MD  07/08/2015, 8:20 AM  Please contact Palliative Medicine Team phone at [phone] for questions and concerns.

## 2015-07-08 NOTE — Progress Notes (Signed)
CSW following for discharge [facility] when medically ready. CSW has completed FL2 & will continue to follow and assist with discharge when ready.    Lincoln MaxinKelly Cheryln Balcom, LCSW [facility] Clinical Social Worker cell #: [phone]

## 2015-07-08 NOTE — Progress Notes (Signed)
Follow up:  Phone call placed to patient's daughter [name] at [phone]. Followed up from family meeting with [name] and patient's son Brett CanalesSteve on 07-07-15. Updated [name] on the patient's condition today.   Patient will likely go to SNF soon, repeat speech eval may be pursued.   PLAN: As per daughter [name], she states her brother and her have discussed extensively:  Patient's code status to be revised to DNR DNI No PEG tube.  Ok to go to SNF attempt rehabilitation Consider hospice at facility if ongoing decline.   15 minutes spent.  Rosalin HawkingZeba Keaten Mashek, MD [phone] [facility] [phone]

## 2015-07-08 NOTE — Progress Notes (Signed)
Speech Language Pathology Treatment: Dysphagia  Patient Details Name: Beatris SiJames D Noller MRN: [id number] DOB: 1935-04-05 Today's Date: 07/08/2015 Time: [id number] SLP Time Calculation (min) (ACUTE ONLY): 11 min  Assessment / Plan / Recommendation Clinical Impression  Pt appears with improved management of secretions currently - RN reports he required suctioning in the middle of the night.  Despite this secretions improvement, he is more lethargic today and less participative than on Friday 7/15.  Oral care provided by RN and nurse technicians has been great!      Pt did voice intermittent during treatment session and his voice was not gurgly/wet, however he is severely dysarthric and incomprehensible.  SLP provided pt with single ice chip, small bolus x2 of applesauce, 2 cup boluses of nectar juice and 1 tsp of water.  Decreased labial opening noted with right anterior spillage of thin water via tsps.  Pt did not seal lips on cup, straw, spoon despite cues.  He required total verbal/tactile cues to accept po and then only accepted a few boluses.  He demonstrated delayed oral transiting but no indications of aspiration/airway compromise as apparent on Friday.  SLP did not provide solids due to severity of dysarthria/dysphagia.    Pt remains high aspiration risk due to his mental status and deconditioning.  If pt to be COMFORT ONLY, dys1/nectar and ice chips would likely maximize pt's comfort with intake based on today's evaluation.  If plan is for continued aggressive care, recommend NPO except ice chips and applesauce with medications.  Do not recommend MBS at this time as it will not change pt's outcomes.  SLP to follow up. No family present today during tx.     HPI Other Pertinent Information: 80 yo male [facility] after being found down = family had been unable to contact pt for 2 days. Pt found to have hypernatremic.  PMH + for TIA and Alzheimer's disease.  MRI negative, cerebral atrophy.  Per RN, pt  lived alone prior to admission.    Pertinent Vitals Pain Assessment: Faces Pain Score: 0-No pain  SLP Plan  Continue with current plan of care    Recommendations Diet recommendations: Dysphagia 1 (puree);Nectar-thick liquid (comfort diet or NPO except ice chips and medicine with applesauce) Liquids provided via: Cup;No straw;Teaspoon Medication Administration: Crushed with puree Supervision: Full supervision/cueing for compensatory strategies Compensations: Slow rate;Small sips/bites;Minimize environmental distractions;Other (Comment) (clean mouth after meals) Postural Changes and/or Swallow Maneuvers: Upright 30-60 min after meal;Seated upright 90 degrees              Oral Care Recommendations: Oral care before and after PO Follow up Recommendations:  (dependent on goals) Plan: Continue with current plan of care    GO    Donavan Burnetamara Shalaunda Weatherholtz, MS [facility] SLP [phone]

## 2015-07-08 NOTE — Progress Notes (Signed)
Pt with slight improvement over last night.  Speech slightly clearer and is attempting to follow commands, and occasionally will allow us to do oral care.  Continuing to have to yaunker suction pt occasionally to assist pt with oral secretions.

## 2015-07-08 NOTE — Progress Notes (Addendum)
Patient ID: Corey Knight Loyd, male   DOB: 07/15/1935, 80 y.o.   MRN: [id number] [facility] PROGRESS NOTE  Corey Knight Mikita WUJ:[id number] DOB: 07/15/1935 DOA: 07/01/2015 PCP: Leotis ShamesSingh,Jasmine, MD  Brief narrative:    80 y.o. male with past medical history of hypertension, dementia, BPH, dyslipidemia who [facility] after he was found unresponsive in bathroom for supposedly 2 days prior to the admission. Per admission note, patient apparently at baseline is able to take care of himself. Because of dementia he is not able to provide details of history of present illness. Imaging studies on the admission did not reveal acute fractures. CT head, cervical spine and MRI did not reveal acute intracranial findings.  Anticipated discharge: Likely 07/09/2015 to skilled nursing facility for mental status improves. Will get SLP for swallow evaluation.    Assessment/Plan:    Principal problem: Acute encephalopathy / Dementia / generalized weakness / dehydration - Patient with underlying dementia which could have contributed to worsening mental status - No acute intracranial findings demonstrated on CT head, cervical spine and MRI all done on the admission - Toxicology screen was negative. Ammonia was 21 on the admission. TSH was within normal limits. - No epileptiform activities seen on EEG. - Patient is still lethargic this morning. - He will be discharged to skilled nursing facility per physical therapy recommendations. - Palliative care was consulted for goals of care. Will follow-up on recommendations. - Awaiting SLP evaluation   Active problems: Hypernatremia - Likely secondary to dehydration secondary to advanced dementia - Sodium was 154 on the admission - Sodium normalized with IV fluids and resolution of dehydration  Leukocytosis - Likely reactive - Urinalysis and chest x-ray did not reveal acute infection - Blood cultures so far show no growth to date  Elevated  troponin - Likely demand ischemia from patient's initial presentation of unresponsiveness, questionable cardiac arrest - Troponin level has trended down to 0.36 from the peak value of 0.6.  Acute kidney injury - Likely secondary to prerenal causes, dehydration - Creatinine has normalized with IV fluids  Essential hypertension - By mouth blood pressure medications on hold because of lethargy - Added hydralazine 5 mg IV every 8 hours scheduled in addition to as needed he drowsy and for blood pressure above 150/90.  Hyperlipidemia  - Resume simvastatin once by mouth intake improves  Multiple wounds/Abrasions/Ulcer - Appreciate wound care consult and their assessment.  BPH - Flomax on hold because patient currently unable to take by mouth medications because of lethargy  Severe protein calorie malnutrition - Patient needs SLP evaluation. If unable to do then we will have to discuss with the family about nutrition and if plan is for feeding tube versus comfort feedings - Palliative care consulted for goals of care   DVT Prophylaxis  - Lovenox subcutaneous ordered   Code Status: Full.  Family Communication:  Family not at the bedside Disposition Plan: to SNF likely 07/09/2015 if mental status improves.   IV access:  Peripheral IV  Procedures and diagnostic studies:    Dg Chest 1 View 07/05/2015   No active disease.  Stable elevation of right hemidiaphragm.     EEG 07/05/2015 This awake and asleep EEG is abnormal due to moderate diffuse slowing of the waking background.  Dg Pelvis 1-2 Views 07/01/2015  No acute findings.     Ct Head Wo Contrast 07/01/2015  No acute intracranial pathology.  No acute cervical spine fracture.     Ct Cervical Spine Wo Contrast  07/01/2015  No acute intracranial pathology.  No acute cervical spine fracture.    Mr Brain Wo Contrast 07/02/2015  1. No acute intracranial infarct or other abnormality identified. 2. Moderate generalized cerebral atrophy with  mild chronic small vessel ischemic disease. 3. Soft tissue edema within the scalp posteriorly, at the vertex, and at the right forehead. 4. Focal skin break down within the left lateral face, compatible with history of pressure ulcer at this location.    Dg Chest Portable 1 View 07/01/2015   No active disease.    Dg Humerus Left 07/01/2015   No fracture of the left humerus.      Medical Consultants:  Cardiology Neurology - phone call only  Other Consultants:  Physical therapy SLP WOC  IAnti-Infectives:   None    Manson Passey, MD  [facility] Pager [phone]  Time spent in minutes: 25 minutes  If 7PM-7AM, please contact night-coverage [email or web address] Password [facility] 07/08/2015, 11:37 AM   LOS: 7 days    HPI/Subjective: No acute overnight events. No respiratory distress.   Objective: Filed Vitals:   07/07/15 1444 07/08/15 0016 07/08/15 0450 07/08/15 0941  BP: 132/83 166/67 176/62   Pulse: 76 76 62   Temp: 98.1 F (36.7 C) 98.8 F (37.1 C) 98.6 F (37 C) 99.5 F (37.5 C)  TempSrc: Axillary Axillary Axillary Rectal  Resp: Height:      Weight:      SpO2: 100% 100% 99%     Intake/Output Summary (Last 24 hours) at 07/08/15 1137 Last data filed at 07/08/15 0500  Gross per 24 hour  Intake   3420 ml  Output   1600 ml  Net   1820 ml    Exam:   General:  Pt is lethargic, no distress  Cardiovascular: Regular rate and rhythm, S1/S2 (+)  Respiratory: Clear to auscultation bilaterally, no wheezing, no crackles, no rhonchi  Abdomen: Soft, non tender, non distended, bowel sounds present  Extremities:Pulses DP and PT palpable bilaterally  Neuro: Grossly nonfocal  Data Reviewed: Basic Metabolic Panel:  Recent Labs Lab 07/03/15 0554 07/04/15 0618 07/05/15 0430 07/06/15 0458 07/08/15 0527  NA 148* 140 137 139 137  K 3.7 4.0 3.5 3.6 3.2*  CL 113* 107 104 102 106  CO2 32 28  GLUCOSE 181* 136* 151* 132* 102*  BUN 44* 32* CREATININE 1.11 0.84 0.80 0.78 0.80  CALCIUM 9.2 8.5* 8.6* 8.7* 8.4*   Liver Function Tests:  Recent Labs Lab 07/02/15 0459 07/03/15 0554 07/04/15 0618 07/05/15 0430 07/06/15 0458  AST 57* 52* 36 27 22  ALT 48 64* 48 40 32  ALKPHOS 45 48 51 55 54  BILITOT 1.5* 1.0 0.9 0.7 0.6  PROT 6.1* 5.3* 4.9* 5.2* 5.3*  ALBUMIN 3.0* 2.5* 2.3* 2.4* 2.5*   No results for input(s): LIPASE, AMYLASE in the last 168 hours.  Recent Labs Lab 07/04/15 0430 07/05/15 0430 07/06/15 0458 07/08/15 0625  AMMONIA 65* CBC:  Recent Labs Lab 07/04/15 0618 07/05/15 0430 07/06/15 0458 07/07/15 0506 07/08/15 0527  WBC 13.0* 13.5* 10.2 12.4* 9.9  NEUTROABS 9.5* 11.2* 7.3 8.8* 7.1  HGB 12.0* 11.6* 11.6* 11.9* 10.7*  HCT 36.6* 35.4* 35.2* 36.0* 32.5*  MCV 93.6 92.7 92.9 92.5 92.6  PLT 105* 141* 163 217 231   Cardiac Enzymes:  Recent Labs Lab 07/01/15 2103 07/02/15 0459 07/02/15 1730 07/04/15 0618  CKTOTAL 283  --   --   --  TROPONINI  --  0.64* 0.54* 0.36*   BNP: Invalid input(s): POCBNP CBG: No results for input(s): GLUCAP in the last 168 hours.  Recent Results (from the past 240 hour(s))  Blood culture (routine x 2)     Status: None   Collection Time: 07/01/15 10:37 PM  Result Value Ref Range Status   Specimen Description BLOOD RIGHT HAND  Final   Culture   Final    NO GROWTH 5 DAYS Performed at [facility]    Report Status 07/06/2015 FINAL  Final  Blood culture (routine x 2)     Status: None   Collection Time: 07/01/15 10:40 PM  Result Value Ref Range Status   Specimen Description BLOOD LEFT ANTECUBITAL  Final   Culture   Final    NO GROWTH 5 DAYS Performed at [facility]    Report Status 07/06/2015 FINAL  Final  Urine culture     Status: None   Collection Time: 07/01/15 11:37 PM  Result Value Ref Range Status   Specimen Description URINE, CATHETERIZED  Final   Special Requests NONE  Final   Culture   Final    NO GROWTH 1 DAY Performed  at [facility]    Report Status 07/03/2015 FINAL  Final  MRSA PCR Screening     Status: None   Collection Time: 07/02/15  9:08 AM  Result Value Ref Range Status   MRSA by PCR NEGATIVE NEGATIVE Final     Scheduled Meds: . antiseptic oral rinse  7 mL Mouth Rinse q12n4p  . aspirin EC  81 mg Oral Daily  . bacitracin   Topical BID  . chlorhexidine  15 mL Mouth Rinse BID  . collagenase   Topical Daily  . enoxaparin (LOVENOX) injection  40 mg Subcutaneous Q24H  . famotidine (PEPCID) IV  20 mg Intravenous Q12H  . methylphenidate  5 mg Oral TID WC  . potassium chloride  10 mEq Intravenous Q1 Hr x 3   Continuous Infusions: . dextrose 150 mL/hr at 07/08/15 1045

## 2015-07-09 DIAGNOSIS — F0390 Unspecified dementia without behavioral disturbance: Secondary | ICD-10-CM

## 2015-07-09 LAB — CBC WITH DIFFERENTIAL/PLATELET
Basophils Absolute: 0 10*3/uL (ref 0.0–0.1)
Basophils Relative: 0 % (ref 0–1)
EOS ABS: 0.2 10*3/uL (ref 0.0–0.7)
EOS PCT: 2 % (ref 0–5)
HCT: 33.1 % — ABNORMAL LOW (ref 39.0–52.0)
Hemoglobin: 11 g/dL — ABNORMAL LOW (ref 13.0–17.0)
LYMPHS ABS: 1.5 10*3/uL (ref 0.7–4.0)
LYMPHS PCT: 15 % (ref 12–46)
MCH: 30.7 pg (ref 26.0–34.0)
MCHC: 33.2 g/dL (ref 30.0–36.0)
MCV: 92.5 fL (ref 78.0–100.0)
MONOS PCT: 10 % (ref 3–12)
Monocytes Absolute: 1 10*3/uL (ref 0.1–1.0)
NEUTROS PCT: 73 % (ref 43–77)
Neutro Abs: 7.5 10*3/uL (ref 1.7–7.7)
Platelets: 278 10*3/uL (ref 150–400)
RBC: 3.58 MIL/uL — ABNORMAL LOW (ref 4.22–5.81)
RDW: 13.2 % (ref 11.5–15.5)
WBC: 10.2 10*3/uL (ref 4.0–10.5)

## 2015-07-09 LAB — CREATININE, SERUM
Creatinine, Ser: 0.72 mg/dL (ref 0.61–1.24)
GFR calc Af Amer: >60 mL/min (ref >60)
GFR calc non Af Amer: >60 mL/min (ref >60)

## 2015-07-09 MED ORDER — CETYLPYRIDINIUM CHLORIDE 0.05 % MT LIQD: 7.0000 mL | Freq: Two times a day (BID) | OROMUCOSAL | Status: AC

## 2015-07-09 MED ORDER — POTASSIUM CHLORIDE ER 10 MEQ PO TBCR: 10.0000 meq | EXTENDED_RELEASE_TABLET | Freq: Every day | ORAL | Status: AC

## 2015-07-09 MED ORDER — BISACODYL 10 MG RE SUPP: 10.0000 mg | Freq: Every day | RECTAL | Status: AC | PRN

## 2015-07-09 MED ORDER — ASPIRIN 81 MG PO TBEC: 81.0000 mg | DELAYED_RELEASE_TABLET | Freq: Every day | ORAL | Status: AC

## 2015-07-09 MED ORDER — COLLAGENASE 250 UNIT/GM EX OINT: TOPICAL_OINTMENT | Freq: Every day | CUTANEOUS | Status: AC

## 2015-07-09 NOTE — Progress Notes (Signed)
Report called to [facility]. Hospital course and discharge reviewed. All questions answered. Julio SicksK. Shanigua Gibb RN

## 2015-07-09 NOTE — Clinical Social Work Placement (Signed)
Patient is set to discharge to [facility] today. Patient & daughter, [name] aware. Discharge packet given to RN, Charline BillsKristyn. [facility] will be called for transport once [employer] authorization is obtained.     Lincoln MaxinKelly Everli Rother, LCSW [facility] Clinical Social Worker cell #: [phone]    CLINICAL SOCIAL WORK PLACEMENT  NOTE  Date:  07/09/2015  Patient Details  Name: Corey Knight MRN: [id number] Date of Birth: 02/14/1935  Clinical Social Work is seeking post-discharge placement for this patient at the Skilled  Nursing Facility level of care (*CSW will initial, date and re-position this form in  chart as items are completed):  Yes   Patient/family provided with [facility] Clinical Social Work Department's list of facilities offering this level of care within the geographic area requested by the patient (or if unable, by the patient's family).  Yes   Patient/family informed of their freedom to choose among providers that offer the needed level of care, that participate in Medicare, Medicaid or managed care program needed by the patient, have an available bed and are willing to accept the patient.  Yes   Patient/family informed of [place]'s ownership interest in [facility] and [facility], as well as of the fact that they are under no obligation to receive care at these facilities.  PASRR submitted to EDS on 07/03/15     PASRR number received on 07/03/15     Existing PASRR number confirmed on       FL2 transmitted to all facilities in geographic area requested by pt/family on 07/03/15     FL2 transmitted to all facilities within larger geographic area on       Patient informed that his/her managed care company has contracts with or will negotiate with certain facilities, including the following:        Yes   Patient/family informed of bed offers received.  [facility] [place]     Physician recommends and  patient chooses bed at      Patient to be [facility] [place] on 07/09/15.  Patient to be transferred to facility by [facility]     Patient family notified on 07/09/15 of transfer.  Name of family member notified:  patient's daughter, [name] via phone     PHYSICIAN       Additional Comment:    _______________________________________________ [name], Sarafina Puthoff F, LCSW 07/09/2015, 11:55 AM

## 2015-07-09 NOTE — Progress Notes (Signed)
Physical Therapy Treatment Patient Details Name: Corey Knight MRN: [id number] DOB: 27-Jul-1935 Today's Date: 07/09/2015    History of Present Illness 80 yo male admiited found on floor at home. Admitted with multiple abrasions/wounds and hypernatremia. Hx of Alz, TIA, HTN.     PT Comments    Pt awake and attempting to make verbalizations however speech is not clear and words out of context.  Pt resistant to ROM of upper and lower extremities (more resistant to UEs).  Pt requiring total assist +2 to roll at this time.  Continue to recommend SNF upon d/c.   Follow Up Recommendations  SNF;Supervision/Assistance - 24 hour     Equipment Recommendations  None recommended by PT    Recommendations for Other Services       Precautions / Restrictions Precautions Precautions: Fall    Mobility  Bed Mobility Overal bed mobility: Needs Assistance;+2 for physical assistance Bed Mobility: Rolling Rolling: Total assist;+2 for physical assistance         General bed mobility comments: pt given time and cues to assist however unable, requiring +2 total assist for rolling.  utilized bed pads to assist with turning and repositioning pt  Transfers Overall transfer level:  (not safe to transfer at this time unless using lift equipment)                  Ambulation/Gait                 Stairs            Wheelchair Mobility    Modified Rankin (Stroke Patients Only)       Balance                                    Cognition Arousal/Alertness: Lethargic   Overall Cognitive Status: No family/caregiver present to determine baseline cognitive functioning                 General Comments: moans with movement, verbalizes however difficult to understand and doesn't seem to be appropriate context    Exercises General Exercises - Lower Extremity Heel Slides: PROM;Both;5 reps;Supine    General Comments        Pertinent Vitals/Pain Pain  Assessment: Faces Pain Score: 2     Home Living                      Prior Function            PT Goals (current goals can now be found in the care plan section) Progress towards PT goals: Not progressing toward goals - comment (difficulty following commands, ?cognition)    Frequency  Min 2X/week    PT Plan Current plan remains appropriate    Co-evaluation             End of Session   Activity Tolerance: Other (comment) (limited by cognition) Patient left: in bed;with call bell/phone within reach;with bed alarm set     Time: [id number] PT Time Calculation (min) (ACUTE ONLY): 15 min  Charges:  $Therapeutic Activity: 8-22 mins                    G Codes:      Corey Knight,Corey Knight 07/09/2015, 2:49 PM Corey Knight, PT, DPT 07/09/2015 Pager: [phone]

## 2015-07-09 NOTE — Discharge Instructions (Signed)

## 2015-07-09 NOTE — Discharge Summary (Signed)
Physician Discharge Summary  Corey Knight ZOX:[id number] DOB: 10/09/1935 DOA: 07/01/2015  PCP: Leotis Shames, MD  Admit date: 07/01/2015 Discharge date: 07/09/2015  Recommendations for Outpatient Follow-up:  1. If there is clinical decline please consider hospice care 2. Dysphagia 1 diet or comfort feed   Discharge Diagnoses:  Active Problems:   Hypernatremia   Hypertension   Change in mental status   Dementia   Pressure ulcer   Encounter for palliative care   Acute encephalopathy   Acute renal insufficiency    Discharge Condition: stable   Diet recommendation: dysphagia 11 or comfort feed   History of present illness:  80 y.o. male with past medical history of hypertension, dementia, BPH, dyslipidemia who presented to [facility] long hospital after he was found unresponsive in bathroom for supposedly 2 days prior to the admission. Per admission note, patient apparently at baseline is able to take care of himself. Because of dementia he is not able to provide details of history of present illness. Imaging studies on the admission did not reveal acute fractures. CT head, cervical spine and MRI did not reveal acute intracranial findings.   Hospital Course:    Assessment/Plan:    Principal problem: Acute encephalopathy / Dementia / generalized weakness / dehydration - Worsening mental status likely due to progression of dementia - No acute intracranial findings demonstrated on CT head, cervical spine and MRI all done on the admission - Toxicology screen was negative. Ammonia was 21 on the admission. TSH was within normal limits. - No epileptiform activities seen on EEG. - No significant changes in mental status over past 24 hours - Palliative care has seen the patient and his family in consultation. Family opted for no aggressive care. They do not wish feeding tube placement. They agree with DNR/DNI CODE STATUS. - Patient is going to be discharged to skilled nursing facility with  hospice option if he further declines. - Diet - dysphagia 1 or comfort feed.  Active problems: Hypernatremia - Likely secondary to dehydration secondary to advanced dementia - Sodium was 154 on the admission - Sodium normalized with IV fluids and resolution of dehydration  Leukocytosis - Likely reactive - Urinalysis and chest x-ray did not reveal acute infection - Blood cultures show no growth to date  Elevated troponin - Likely demand ischemia from patient's initial presentation of unresponsiveness, questionable cardiac arrest - Troponin level has trended down to 0.36 from the peak value of 0.6.  Acute kidney injury - Likely secondary to prerenal causes, dehydration - Creatinine has normalized with IV fluids  Essential hypertension - Patient will continue to take Norvasc, hydralazine and metoprolol   Hyperlipidemia  - Continue statin therapy  Multiple wounds/Abrasions/Ulcer - Appreciate wound care consult and their assessment.  BPH - Continue Flomax on discharge  Severe protein calorie malnutrition - per SLP evaluation, dysphagia 1 diet versus comfort feeds whatever family feels they're comfortable with for patient to take   DVT Prophylaxis  - Lovenox subcutaneous ordered while patient in hospital    Code Status: DNR/DNI Family Communication: family not at the bedside morning    IV access:  Peripheral IV  Procedures and diagnostic studies:   Dg Chest 1 View 07/05/2015 No active disease. Stable elevation of right hemidiaphragm.   EEG 07/05/2015 This awake and asleep EEG is abnormal due to moderate diffuse slowing of the waking background.  Dg Pelvis 1-2 Views 07/01/2015 No acute findings.   Ct Head Wo Contrast 07/01/2015 No acute intracranial pathology. No acute cervical spine fracture.  Ct Cervical Spine Wo Contrast 07/01/2015 No acute intracranial pathology. No acute cervical spine fracture.   Mr Brain Wo Contrast 07/02/2015 1. No acute  intracranial infarct or other abnormality identified. 2. Moderate generalized cerebral atrophy with mild chronic small vessel ischemic disease. 3. Soft tissue edema within the scalp posteriorly, at the vertex, and at the right forehead. 4. Focal skin break down within the left lateral face, compatible with history of pressure ulcer at this location.   Dg Chest Portable 1 View 07/01/2015 No active disease.   Dg Humerus Left 07/01/2015 No fracture of the left humerus.    Medical Consultants:  Cardiology Neurology - phone call only  Other Consultants:  Physical therapy SLP WOC  IAnti-Infectives:   None   Signed:  Manson Passey, MD  [facility] 07/09/2015, 9:36 AM  Pager #: [phone]  Time spent in minutes: more than 30 minutes   Discharge Exam: Filed Vitals:   07/09/15 0433  BP: 159/67  Pulse: 76  Temp: 98.3 F (36.8 C)  Resp: 18   Filed Vitals:   07/08/15 0941 07/08/15 1409 07/08/15 2039 07/09/15 0433  BP:  127/60 163/67 159/67  Pulse:  66 78 76  Temp: 99.5 F (37.5 C) 98.6 F (37 C) 98.8 F (37.1 C) 98.3 F (36.8 C)  TempSrc: Rectal Axillary Axillary Axillary  Resp:  18 18 18   Height:      Weight:      SpO2:  99% 99% 99%    General: Pt is not in acute distress Cardiovascular: Rate controlled, S1/S2 appreciated  Respiratory: No wheezing, no crackles, no rhonchi Abdominal: Soft, non tender, non distended, bowel sounds +, no guarding Extremities: no cyanosis, pulses palpable bilaterally DP and PT Neuro: Grossly nonfocal  Discharge Instructions  Discharge Instructions    Call MD for:  difficulty breathing, headache or visual disturbances    Complete by:  As directed      Call MD for:  persistant nausea and vomiting    Complete by:  As directed      Call MD for:  severe uncontrolled pain    Complete by:  As directed      Diet - low sodium heart healthy    Complete by:  As directed      Increase activity slowly    Complete by:   As directed             Medication List    STOP taking these medications        hydrALAZINE 25 MG tablet  Commonly known as:  APRESOLINE     levofloxacin 500 MG tablet  Commonly known as:  LEVAQUIN     oxyCODONE 5 MG immediate release tablet  Commonly known as:  ROXICODONE      TAKE these medications        amLODipine 10 MG tablet  Commonly known as:  NORVASC  Take 10 mg by mouth daily.     antiseptic oral rinse 0.05 % Liqd solution  Commonly known as:  CPC / CETYLPYRIDINIUM CHLORIDE 0.05%  7 mLs by Mouth Rinse route 2 times daily at 12 noon and 4 pm.     aspirin 81 MG EC tablet  Take 1 tablet (81 mg total) by mouth daily.     bisacodyl 10 MG suppository  Commonly known as:  DULCOLAX  Place 1 suppository (10 mg total) rectally daily as needed for moderate constipation.     collagenase ointment  Commonly known as:  Scientist, clinical (histocompatibility and immunogenetics)  topically daily.     donepezil 10 MG tablet  Commonly known as:  ARICEPT  Take 10 mg by mouth at bedtime.     losartan-hydrochlorothiazide 100-12.5 MG per tablet  Commonly known as:  HYZAAR  Take 1 tablet by mouth daily.     memantine 10 MG tablet  Commonly known as:  NAMENDA  Take 10 mg by mouth 2 (two) times daily.     metoprolol succinate 50 MG 24 hr tablet  Commonly known as:  TOPROL-XL  Take 25 mg by mouth daily. Take with or immediately following a meal.     NUCYNTA 50 MG Tabs tablet  Generic drug:  tapentadol  Take 1 tablet (50 mg total) by mouth every 6 (six) hours as needed. 1 TO 2 TABS Q 6 HOURS PRN PAIN     ondansetron 8 MG disintegrating tablet  Commonly known as:  ZOFRAN ODT  Take 1 tablet (8 mg total) by mouth every 6 (six) hours as needed for nausea or vomiting.     potassium chloride 10 MEQ tablet  Commonly known as:  K-DUR  Take 1 tablet (10 mEq total) by mouth daily.     simvastatin 10 MG tablet  Commonly known as:  ZOCOR  Take 10 mg by mouth daily.     tamsulosin 0.4 MG Caps capsule  Commonly known  as:  FLOMAX  Take 1 capsule (0.4 mg total) by mouth daily.           Follow-up Information    Follow up with Singh,Jasmine, MD. Schedule an appointment as soon as possible for a visit in 1 week.   Specialty:  Internal Medicine   Why:  Follow up appt after recent hospitalization   Contact information:   [address] [place] [address] [phone]        The results of significant diagnostics from this hospitalization (including imaging, microbiology, ancillary and laboratory) are listed below for reference.    Significant Diagnostic Studies: Dg Chest 1 View  07/05/2015   CLINICAL DATA:  Altered mental status.  EXAM: CHEST  1 VIEW  COMPARISON:  07/01/2015  FINDINGS: Stable mild elevation of the right hemidiaphragm with volume loss at the right lung base. There is no evidence of pulmonary edema, consolidation, pneumothorax, nodule or pleural fluid. The heart size and mediastinal contours are normal.  IMPRESSION: No active disease.  Stable elevation of right hemidiaphragm.   Electronically Signed   By: Irish Lack M.D.   On: 07/05/2015 16:47   Dg Pelvis 1-2 Views  07/01/2015   CLINICAL DATA:  80 year old male found on the floor.  Set  EXAM: PELVIS - 1-2 VIEW  COMPARISON:  None.  FINDINGS: There is no evidence of pelvic fracture or diastasis. No pelvic bone lesions are seen.  IMPRESSION: No acute findings.   Electronically Signed   By: Elgie Collard M.D.   On: 07/01/2015 21:15   Ct Head Wo Contrast  07/01/2015   CLINICAL DATA:  80 year old male found on the floor  EXAM: CT HEAD WITHOUT CONTRAST  CT CERVICAL SPINE WITHOUT CONTRAST  TECHNIQUE: Multidetector CT imaging of the head and cervical spine was performed following the standard protocol without intravenous contrast. Multiplanar CT image reconstructions of the cervical spine were also generated.  COMPARISON:  None.  FINDINGS: CT HEAD FINDINGS  There is slight prominence of the ventricles and sulci compatible with  age-related volume loss. Mild periventricular and deep white matter hypodensities represent chronic microvascular ischemic changes. There is  no intracranial hemorrhage. No mass effect or midline shift identified.  There is mild mucoperiosteal thickening of paranasal sinuses. The mastoid air cells are well aerated. There is opacification of the left external auditory canal. Correlation with clinical exam recommended. Small right forehead and left occipital scalp screws noted. The calvarium is intact.  CT CERVICAL SPINE FINDINGS  There is no acute fracture or subluxation of the cervical spine.Multilevel degenerative changes. There is loss of disc space height at C7-T1.The odontoid and spinous processes are intact.There is normal anatomic alignment of the C1-C2 lateral masses. The visualized soft tissues appear unremarkable.  Left thyroid partially calcified hypodense nodule. Ultrasound may provide better evaluation. There is diffuse stranding of the subcutaneous soft tissues of the anterior neck.  IMPRESSION: No acute intracranial pathology.  No acute cervical spine fracture.   Electronically Signed   By: Elgie Collard M.D.   On: 07/01/2015 22:15   Ct Cervical Spine Wo Contrast  07/01/2015   CLINICAL DATA:  80 year old male found on the floor  EXAM: CT HEAD WITHOUT CONTRAST  CT CERVICAL SPINE WITHOUT CONTRAST  TECHNIQUE: Multidetector CT imaging of the head and cervical spine was performed following the standard protocol without intravenous contrast. Multiplanar CT image reconstructions of the cervical spine were also generated.  COMPARISON:  None.  FINDINGS: CT HEAD FINDINGS  There is slight prominence of the ventricles and sulci compatible with age-related volume loss. Mild periventricular and deep white matter hypodensities represent chronic microvascular ischemic changes. There is no intracranial hemorrhage. No mass effect or midline shift identified.  There is mild mucoperiosteal thickening of paranasal  sinuses. The mastoid air cells are well aerated. There is opacification of the left external auditory canal. Correlation with clinical exam recommended. Small right forehead and left occipital scalp screws noted. The calvarium is intact.  CT CERVICAL SPINE FINDINGS  There is no acute fracture or subluxation of the cervical spine.Multilevel degenerative changes. There is loss of disc space height at C7-T1.The odontoid and spinous processes are intact.There is normal anatomic alignment of the C1-C2 lateral masses. The visualized soft tissues appear unremarkable.  Left thyroid partially calcified hypodense nodule. Ultrasound may provide better evaluation. There is diffuse stranding of the subcutaneous soft tissues of the anterior neck.  IMPRESSION: No acute intracranial pathology.  No acute cervical spine fracture.   Electronically Signed   By: Elgie Collard M.D.   On: 07/01/2015 22:15   Mr Brain Wo Contrast  07/02/2015   CLINICAL DATA:  Initial evaluation for acute altered mental status, found down, now unresponsive. Delete to have been down for 4-5 days. History of dementia.  EXAM: MRI HEAD WITHOUT CONTRAST  TECHNIQUE: Multiplanar, multiecho pulse sequences of the brain and surrounding structures were obtained without intravenous contrast.  COMPARISON:  Prior CT from 07/01/2015  FINDINGS: Diffuse prominence of the CSF containing spaces is compatible with generalized cerebral atrophy. Patchy and confluent T2/FLAIR hyperintensity within the periventricular deep white matter both cerebral hemispheres present, most likely related to chronic small vessel ischemic disease, fairly mild for patient age.  No abnormal foci of restricted diffusion to suggest acute intracranial infarct identified. Gray-white matter differentiation maintained. Normal intravascular flow voids are preserved. No acute or chronic intracranial hemorrhage. No areas of chronic infarction identified.  No mass lesion, midline shift, or mass effect.  Mild ventricular prominence related to global parenchymal volume loss present without hydrocephalus. No extra-axial fluid collection.  Craniocervical junction within normal limits. Visualized upper cervical spine demonstrates no acute abnormality. Pituitary gland within normal  limits. Midline structures intact.  No acute abnormality about the orbits.  Mild mucosal thickening within the sphenoid sinuses. Small air-fluid levels within the right sphenoid sinus. Paranasal sinuses are otherwise clear. Minimal scattered opacity within the left mastoid air cells without frank mastoid effusion. Inner ear structures within normal limits.  Bone marrow signal intensity within normal limits.  Edema present within the scalp soft tissues posteriorly and at the vertex. Edema present at the right forehead as well. Probable area of skin breakdown present within the visualized left lateral face, compatible with history of pressure ulcer at this location. Skin thinning present at the scalp vertex as well.  IMPRESSION: 1. No acute intracranial infarct or other abnormality identified. 2. Moderate generalized cerebral atrophy with mild chronic small vessel ischemic disease. 3. Soft tissue edema within the scalp posteriorly, at the vertex, and at the right forehead. 4. Focal skin break down within the left lateral face, compatible with history of pressure ulcer at this location.   Electronically Signed   By: Rise MuBenjamin  McClintock M.D.   On: 07/02/2015 07:24   Dg Chest Portable 1 View  07/01/2015   CLINICAL DATA:  80 year old male found on the floor  EXAM: PORTABLE CHEST - 1 VIEW  COMPARISON:  None.  FINDINGS: The heart size and mediastinal contours are within normal limits. Both lungs are clear. The visualized skeletal structures are unremarkable.  IMPRESSION: No active disease.   Electronically Signed   By: Elgie CollardArash  Radparvar M.D.   On: 07/01/2015 21:16   Dg Humerus Left  07/01/2015   CLINICAL DATA:  80 year old male status post fall  with abrasion and bruised the proximal humerus  EXAM: LEFT HUMERUS - 2+ VIEW  COMPARISON:  None.  FINDINGS: There is no evidence of fracture or other focal bone lesions. Soft tissues are unremarkable.  IMPRESSION: No fracture of the left humerus.   Electronically Signed   By: Elgie CollardArash  Radparvar M.D.   On: 07/01/2015 23:39    Microbiology: Recent Results (from the past 240 hour(s))  Blood culture (routine x 2)     Status: None   Collection Time: 07/01/15 10:37 PM  Result Value Ref Range Status   Specimen Description BLOOD RIGHT HAND  Final   Special Requests BOTTLES DRAWN AEROBIC AND ANAEROBIC 3CC EA  Final   Culture   Final    NO GROWTH 5 DAYS Performed at [facility]    Report Status 07/06/2015 FINAL  Final  Blood culture (routine x 2)     Status: None   Collection Time: 07/01/15 10:40 PM  Result Value Ref Range Status   Specimen Description BLOOD LEFT ANTECUBITAL  Final   Special Requests BOTTLES DRAWN AEROBIC AND ANAEROBIC 5CC EA  Final   Culture   Final    NO GROWTH 5 DAYS Performed at [facility]    Report Status 07/06/2015 FINAL  Final  Urine culture     Status: None   Collection Time: 07/01/15 11:37 PM  Result Value Ref Range Status   Specimen Description URINE, CATHETERIZED  Final   Special Requests NONE  Final   Culture   Final    NO GROWTH 1 DAY Performed at [facility]    Report Status 07/03/2015 FINAL  Final  MRSA PCR Screening     Status: None   Collection Time: 07/02/15  9:08 AM  Result Value Ref Range Status   MRSA by PCR NEGATIVE NEGATIVE Final    Comment:  The GeneXpert MRSA Assay (FDA approved for NASAL specimens only), is one component of a comprehensive MRSA colonization surveillance program. It is not intended to diagnose MRSA infection nor to guide or monitor treatment for MRSA infections.      Labs: Basic Metabolic Panel:  Recent Labs Lab 07/03/15 0554 07/04/15 0618 07/05/15 0430 07/06/15 0458  07/08/15 0527 07/09/15 0430  NA 148* 140 137 139 137  --   K 3.7 4.0 3.5 3.6 3.2*  --   CL 113* 107 104 102 106  --   CO2 28 27 29  32 28  --   GLUCOSE 181* 136* 151* 132* 102*  --   BUN 44* 32* 20 13 11   --   CREATININE 1.11 0.84 0.80 0.78 0.80 0.72  CALCIUM 9.2 8.5* 8.6* 8.7* 8.4*  --    Liver Function Tests:  Recent Labs Lab 07/03/15 0554 07/04/15 0618 07/05/15 0430 07/06/15 0458  AST 52* 36 27 22  ALT 64* 48 40 32  ALKPHOS 48 51 55 54  BILITOT 1.0 0.9 0.7 0.6  PROT 5.3* 4.9* 5.2* 5.3*  ALBUMIN 2.5* 2.3* 2.4* 2.5*   No results for input(s): LIPASE, AMYLASE in the last 168 hours.  Recent Labs Lab 07/04/15 0430 07/05/15 0430 07/06/15 0458 07/08/15 0625  AMMONIA 65* 13 21 9    CBC:  Recent Labs Lab 07/05/15 0430 07/06/15 0458 07/07/15 0506 07/08/15 0527 07/09/15 0430  WBC 13.5* 10.2 12.4* 9.9 10.2  NEUTROABS 11.2* 7.3 8.8* 7.1 7.5  HGB 11.6* 11.6* 11.9* 10.7* 11.0*  HCT 35.4* 35.2* 36.0* 32.5* 33.1*  MCV 92.7 92.9 92.5 92.6 92.5  PLT 141* 163 217 231 278   Cardiac Enzymes:  Recent Labs Lab 07/02/15 1730 07/04/15 0618  TROPONINI 0.54* 0.36*   BNP: BNP (last 3 results) No results for input(s): BNP in the last 8760 hours.  ProBNP (last 3 results) No results for input(s): PROBNP in the last 8760 hours.  CBG: No results for input(s): GLUCAP in the last 168 hours.

## 2015-07-09 NOTE — Progress Notes (Signed)
[employer] authorization obtained. Patient's daughter, Arta BruceMichelle & Doug at [facility] aware. [facility] called for transport.      Lincoln MaxinKelly Dishon Kehoe, LCSW [facility] Clinical Social Worker cell #: [phone]

## 2015-07-09 NOTE — Care Management Note (Signed)
Case Management Note  Patient Details  Name: Corey Knight MRN: [id number] Date of Birth: [birth year]  Subjective/Objective:                   Hypernatremia  Hypertension  Change in mental status Action/Plan: Discharge planning  Expected Discharge Date:   (unknown)               Expected Discharge Plan:  Skilled Nursing Facility (has bed at [facility] )  In-House Referral:  Clinical Social Work  Discharge planning Services     Post Acute Care Choice:    Choice offered to:     DME Arranged:    DME Agency:     HH Arranged:    HH Agency:     Status of Service:  Completed, signed off  Medicare Important Message Given:  Yes-second notification given Date Medicare IM Given:    Medicare IM give by:    Date Additional Medicare IM Given:    Additional Medicare Important Message give by:     If discussed at Long Length of Stay Meetings, dates discussed:    Additional Comments: CM notes pt to go to SNF; CSW arranging.  NO other CM needs were communicated. Corey Knight, Corey Lehrmann Christine, RN 07/09/2015, 10:55 AM

## 2015-07-10 LAB — LYME DISEASE DNA BY PCR(BORRELIA BURG): Lyme Disease(B.burgdorferi)PCR: NEGATIVE

## 2015-07-22 DEATH — deceased

## 2015-08-30 ENCOUNTER — Encounter: Payer: Self-pay | Admitting: Urology

## 2016-05-06 IMAGING — CT CT CERVICAL SPINE W/O CM
4 of 6 series · 14 of 33 positions shown, 16 images · non-contrast
Comparison: None.

CLINICAL DATA: 79-year-old male found on the floor

EXAM:
CT HEAD WITHOUT CONTRAST
CT CERVICAL SPINE WITHOUT CONTRAST
TECHNIQUE: Multidetector CT imaging of the head and cervical spine was
performed following the standard protocol without intravenous
contrast. Multiplanar CT image reconstructions of the cervical spine
were also generated.

[Series 5: bone windows · axial · 0.43mm/px · z∈[-35,+148]mm · 3 of 62 slices shown, 4 images]
[im 1/62  soft-tissue]
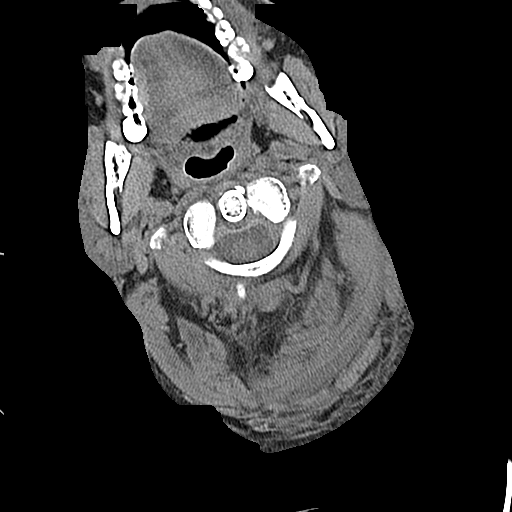
[im 1/62  bone]
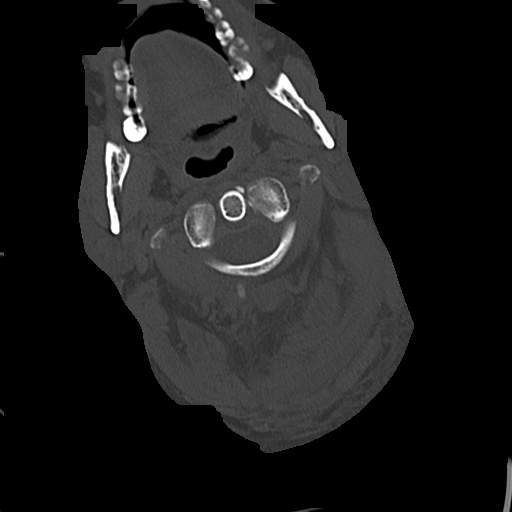
[im 31/62  bone]
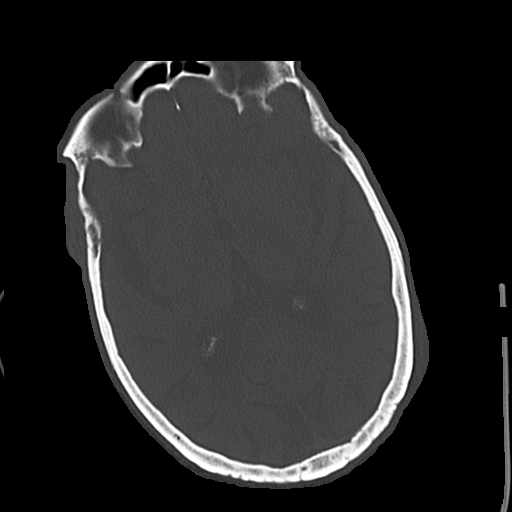
[im 62/62  bone]
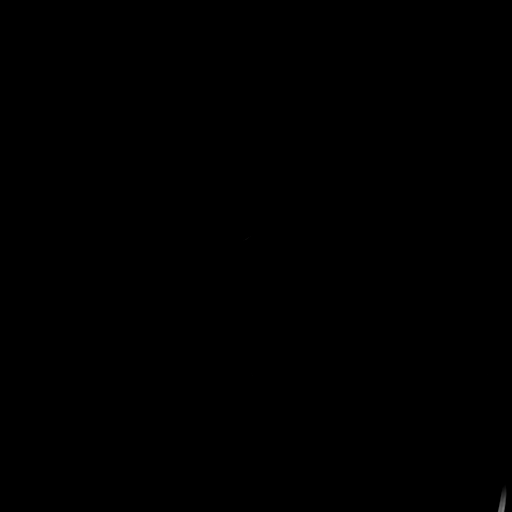

[Series 9: axial recon · axial · 0.23mm/px · z∈[-174,-85]mm · 3 of 98 slices shown]
[im 25/98  bone]
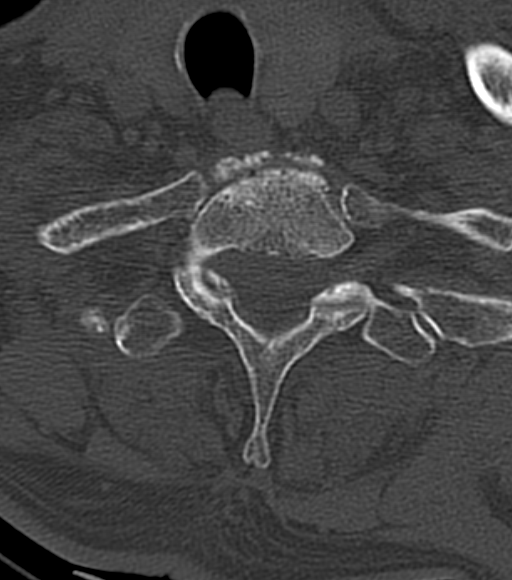
[im 49/98  bone]
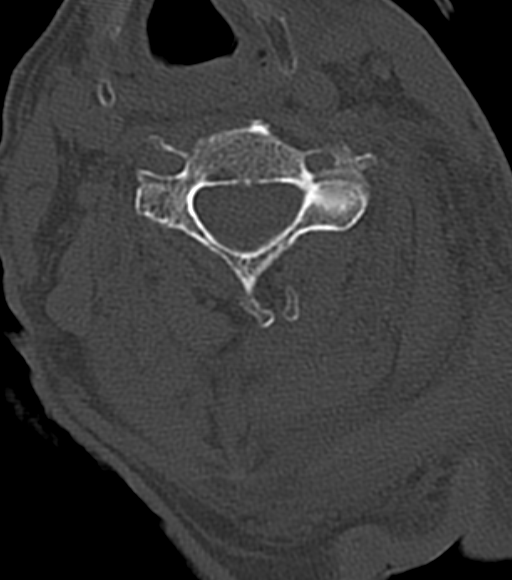
[im 73/98  bone]
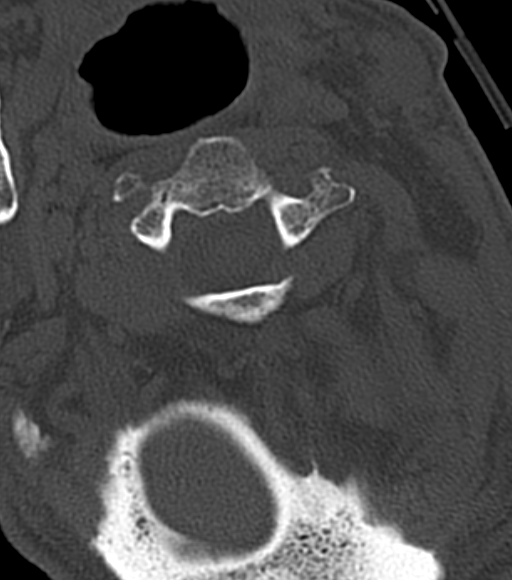

[Series 10: coronal · coronal · 0.25mm/px · 3 of 65 slices shown]
[im 18/65  bone]
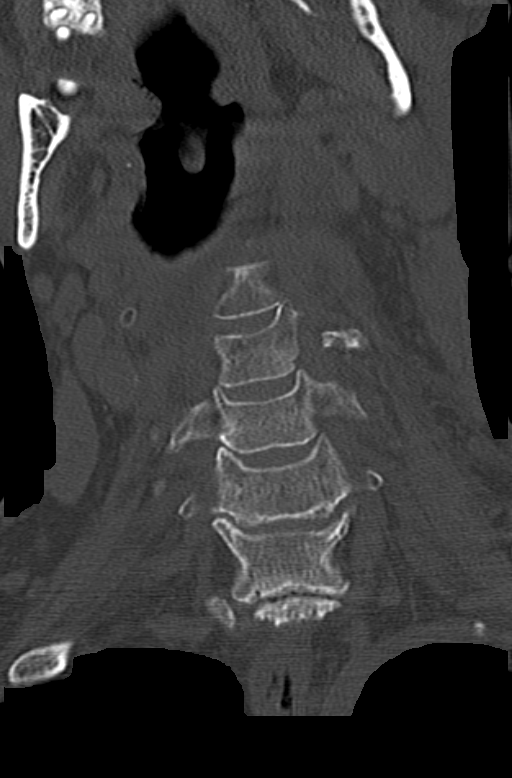
[im 28/65  bone]
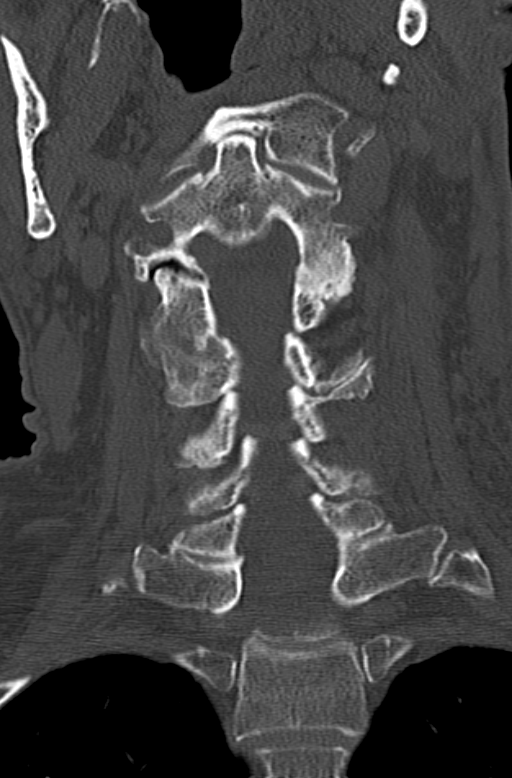
[im 37/65  bone]
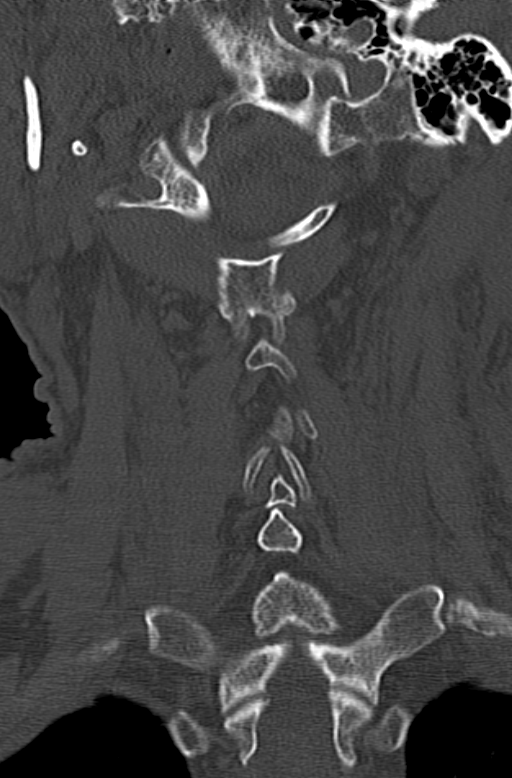

[Series 11: sagittal · sagittal · 0.30mm/px · 5 of 61 slices shown, 6 images]
[im 21/61  bone]
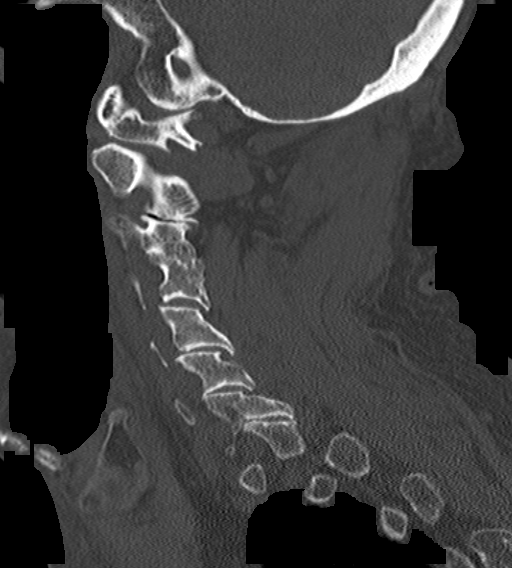
[im 26/61  bone]
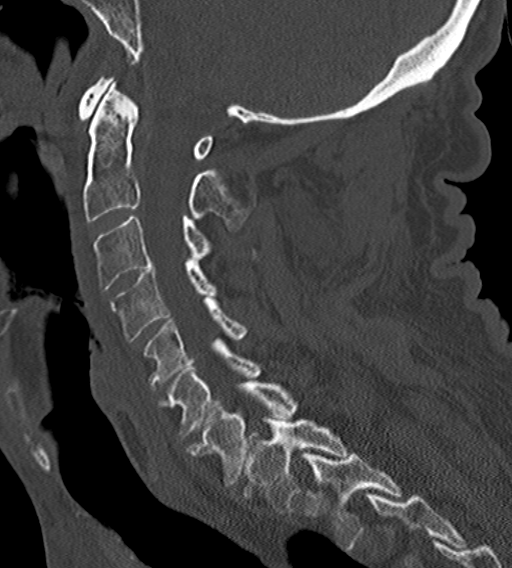
[im 31/61  soft-tissue]
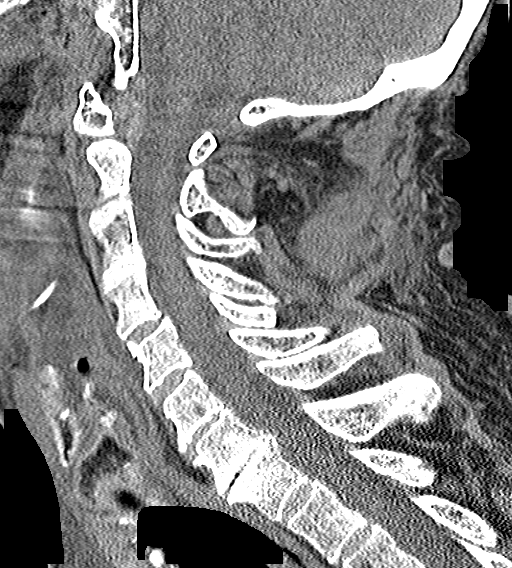
[im 31/61  bone]
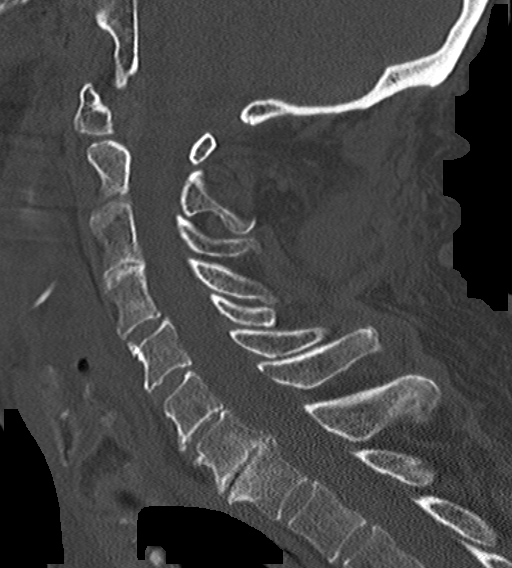
[im 36/61  bone]
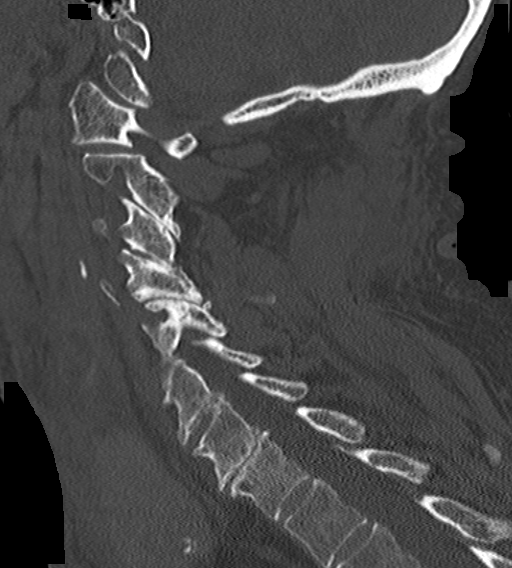
[im 41/61  bone]
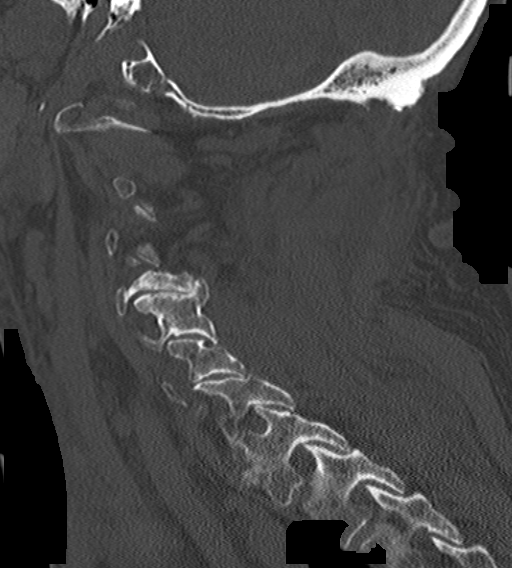

[14 of 33 positions shown; findings below may reference images not displayed]

FINDINGS: CT HEAD FINDINGS

There is slight prominence of the ventricles and sulci compatible
with age-related volume loss. Mild periventricular and deep white
matter hypodensities represent chronic microvascular ischemic
changes. There is no intracranial hemorrhage. No mass effect or
midline shift identified.

There is mild mucoperiosteal thickening of paranasal sinuses. The
mastoid air cells are well aerated. There is opacification of the
left external auditory canal. Correlation with clinical exam
recommended. Small right forehead and left occipital scalp screws
noted. The calvarium is intact.

CT CERVICAL SPINE FINDINGS

There is no acute fracture or subluxation of the cervical
spine.Multilevel degenerative changes. There is loss of disc space
height at C7-T1.The odontoid and spinous processes are intact.There
is normal anatomic alignment of the C1-C2 lateral masses. The
visualized soft tissues appear unremarkable.

Left thyroid partially calcified hypodense nodule. Ultrasound may
provide better evaluation. There is diffuse stranding of the
subcutaneous soft tissues of the anterior neck.
IMPRESSION: No acute intracranial pathology.

No acute cervical spine fracture.

## 2016-05-10 IMAGING — CR DG CHEST 1V
1 series · 1 of 1 positions shown · non-contrast
Comparison: 07/01/2015

CLINICAL DATA: Altered mental status.

EXAM:
CHEST  1 VIEW

[AP]
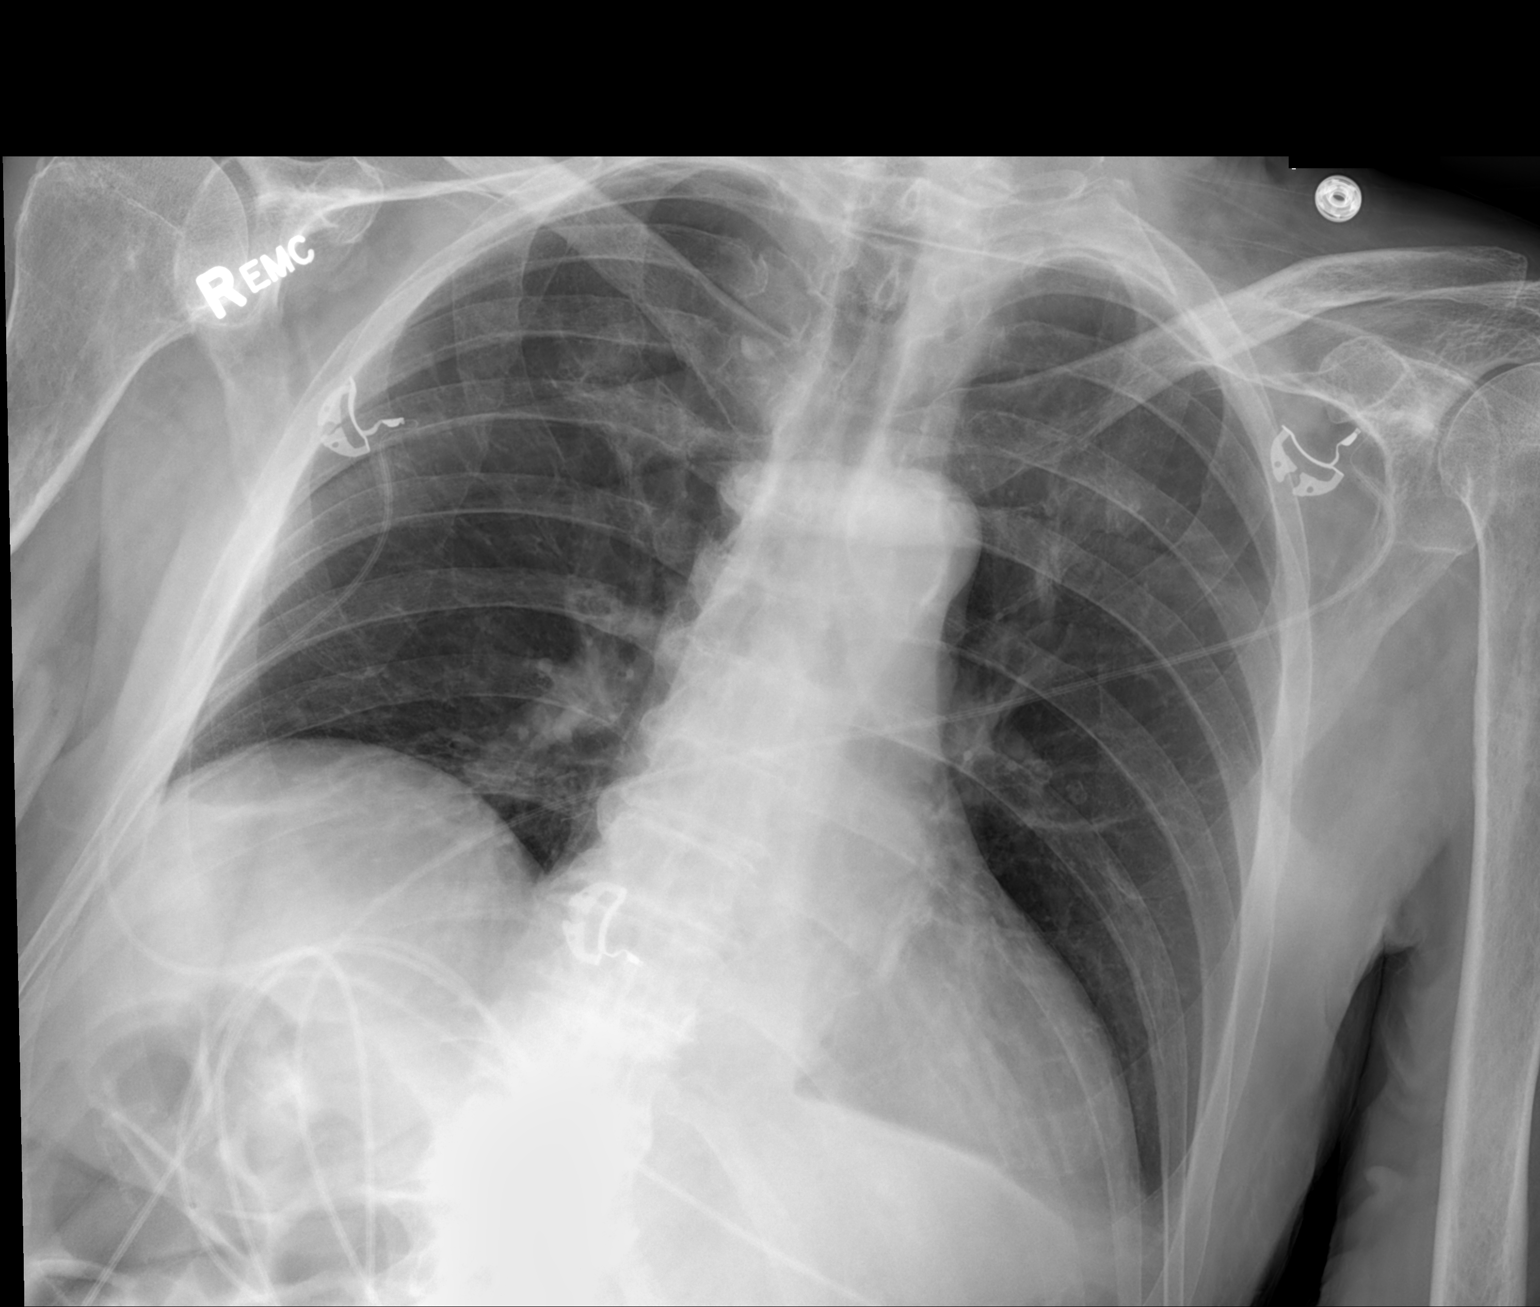

[1 of 1 positions shown; findings below may reference images not displayed]

FINDINGS: Stable mild elevation of the right hemidiaphragm with volume loss at
the right lung base. There is no evidence of pulmonary edema,
consolidation, pneumothorax, nodule or pleural fluid. The heart size
and mediastinal contours are normal.
IMPRESSION: No active disease.  Stable elevation of right hemidiaphragm.
